# Patient Record
Sex: Female | Born: 1950 | Race: White | Hispanic: No | Marital: Married | Smoking: Never smoker
Health system: Southern US, Community
[De-identification: ages and names within clinical notes are randomized; demographics above are authoritative.]

## PROBLEM LIST (undated history)

## (undated) ENCOUNTER — Emergency Department: Discharge status: Absent without leave | Payer: Self-pay

## (undated) DIAGNOSIS — E785 Hyperlipidemia, unspecified: Secondary | ICD-10-CM

## (undated) DIAGNOSIS — I10 Essential (primary) hypertension: Secondary | ICD-10-CM

## (undated) DIAGNOSIS — I341 Nonrheumatic mitral (valve) prolapse: Secondary | ICD-10-CM

## (undated) HISTORY — DX: Hyperlipidemia, unspecified: E78.5

## (undated) HISTORY — DX: Nonrheumatic mitral (valve) prolapse: I34.1

## (undated) HISTORY — DX: Essential (primary) hypertension: I10

## (undated) HISTORY — PX: TONSILLECTOMY: SUR1361

---

## 1998-05-22 ENCOUNTER — Other Ambulatory Visit: Admission: RE | Admit: 1998-05-22 | Discharge: 1998-05-22 | Payer: Self-pay | Admitting: Family Medicine

## 2006-08-23 ENCOUNTER — Encounter: Admission: RE | Admit: 2006-08-23 | Discharge: 2006-09-15 | Payer: Self-pay | Admitting: Orthopedic Surgery

## 2006-08-23 ENCOUNTER — Encounter: Admission: RE | Admit: 2006-08-23 | Discharge: 2006-08-23 | Payer: Self-pay | Admitting: Orthopedic Surgery

## 2006-12-31 ENCOUNTER — Emergency Department (HOSPITAL_COMMUNITY): Admission: EM | Admit: 2006-12-31 | Discharge: 2006-12-31 | Payer: Self-pay | Admitting: Emergency Medicine

## 2007-12-19 ENCOUNTER — Ambulatory Visit: Payer: Self-pay | Admitting: Occupational Medicine

## 2010-10-17 LAB — COMPREHENSIVE METABOLIC PANEL
AST: 17
Albumin: 3.6
Calcium: 8.7
Creatinine, Ser: 0.71
GFR calc Af Amer: >60
GFR calc non Af Amer: >60

## 2010-10-17 LAB — CBC
MCHC: 35.5
MCV: 85.6
Platelets: 264

## 2010-10-17 LAB — DIFFERENTIAL
Eosinophils Relative: 0
Lymphocytes Relative: 17
Lymphs Abs: 2
Monocytes Absolute: 1

## 2010-10-17 LAB — LIPASE, BLOOD: Lipase: 20

## 2010-10-17 LAB — URINALYSIS, ROUTINE W REFLEX MICROSCOPIC
Bilirubin Urine: NEGATIVE
Hgb urine dipstick: NEGATIVE
Protein, ur: NEGATIVE
Urobilinogen, UA: 0.2

## 2011-06-09 ENCOUNTER — Other Ambulatory Visit (HOSPITAL_COMMUNITY)
Admission: RE | Admit: 2011-06-09 | Discharge: 2011-06-09 | Disposition: A | Discharge status: Home / Self Care | Payer: 59 | Source: Ambulatory Visit | Attending: Family Medicine | Admitting: Family Medicine

## 2011-06-09 DIAGNOSIS — Z1159 Encounter for screening for other viral diseases: Secondary | ICD-10-CM | POA: Insufficient documentation

## 2011-06-09 DIAGNOSIS — Z124 Encounter for screening for malignant neoplasm of cervix: Secondary | ICD-10-CM | POA: Insufficient documentation

## 2011-11-03 ENCOUNTER — Encounter: Payer: Self-pay | Admitting: Emergency Medicine

## 2011-11-03 ENCOUNTER — Emergency Department
Admission: EM | Admit: 2011-11-03 | Discharge: 2011-11-03 | Disposition: A | Discharge status: Home / Self Care | Payer: 59 | Source: Home / Self Care | Attending: Family Medicine | Admitting: Family Medicine

## 2011-11-03 DIAGNOSIS — J029 Acute pharyngitis, unspecified: Secondary | ICD-10-CM

## 2011-11-03 MED ORDER — BENZONATATE 200 MG PO CAPS: 200.0000 mg | ORAL_CAPSULE | Freq: Every day | ORAL | Status: DC

## 2011-11-03 NOTE — ED Provider Notes (Signed)
History     CSN: [id number]  Arrival date & time 11/03/11  1305   First MD Initiated Contact with Patient 11/03/11 1313      Chief Complaint  Patient presents with  . Sore Throat     HPI Comments: Patient complains of approximately 2.5 day history of gradually progressive URI symptoms beginning with a mild sore throat, followed by progressive nasal congestion.  A cough started today.  Complains of fatigue but no myalgias.  Cough is generally non-productive.  There has been no pleuritic pain, shortness of breath, or wheezes.   The history is provided by the patient.    History reviewed. No pertinent past medical history.  Past Surgical History  Procedure Date  . Tonsillectomy     Family History  Problem Relation Age of Onset  . Cancer Mother   . Heart failure Father     History  Substance Use Topics  . Smoking status: Never Smoker   . Smokeless tobacco: Not on file  . Alcohol Use: No    OB History    Grav Para Term Preterm Abortions TAB SAB Ect Mult Living                  Review of Systems + sore throat + cough No pleuritic pain No wheezing + nasal congestion ? post-nasal drainage No sinus pain/pressure No itchy/red eyes ? earache No hemoptysis No SOB No fever/chills No nausea No vomiting No abdominal pain No diarrhea No urinary symptoms No skin rashes + fatigue No myalgias No headache Used OTC meds without relief  Allergies  Review of patient's allergies indicates no known allergies.  Home Medications   Current Outpatient Rx  Name Route Sig Dispense Refill  . BENZONATATE 200 MG PO CAPS Oral Take 1 capsule (200 mg total) by mouth at bedtime. Take as needed for cough 12 capsule 0    BP 141/70  Pulse 89  Temp 98.5 F (36.9 C) (Oral)  Resp 14  Ht 5\' 5"  (1.651 m)  Wt 187 lb (84.823 kg)  BMI 31.12 kg/m2  SpO2 97%  Physical Exam Nursing notes and Vital Signs reviewed. Appearance:  Patient appears stated age, and in no acute distress.   Patient is obese (BMI 31.1) Eyes:  Pupils are equal, round, and reactive to light and accomodation.  Extraocular movement is intact.  Conjunctivae are not inflamed  Ears:  Canals normal.  Tympanic membranes normal.  Nose:  Mildly congested turbinates.  No sinus tenderness.  Pharynx:  Normal Neck:  Supple.  Posterior nodes are prominent but nontender Lungs:  Clear to auscultation.  Breath sounds are equal.  Heart:  Regular rate and rhythm without murmurs, rubs, or gallops.  Abdomen:  Nontender without masses or hepatosplenomegaly.  Bowel sounds are present.  No CVA or flank tenderness.  Extremities:  No edema.  No calf tenderness Skin:  No rash present.   ED Course  Procedures  none  Labs Reviewed -  POCT rapid strep test negative    1. Acute pharyngitis; suspect early viral URI.  Centor score -1      MDM  There is no evidence of bacterial infection today.   Treat symptomatically for now  Prescription written for Benzonatate (Tessalon) to take at bedtime for night-time cough.  Take plain Mucinex (guaifenesin) twice daily for cough and congestion.  Increase fluid intake, rest. May use Afrin nasal spray (or generic oxymetazoline) twice daily for about 5 days.  Also recommend using saline nasal spray several  times daily and saline nasal irrigation (AYR is a common brand) Stop all antihistamines for now, and other non-prescription cough/cold preparations. May take Ibuprofen 200mg , 4 tabs every 8 hours with food for pain Recommend flu shot when well. Follow-up with family doctor if not improving 7 to 10 days.         Lattie Haw, MD 11/03/11 [phone]

## 2011-11-03 NOTE — ED Notes (Signed)
Sore throat, congestion, slight cough x 3 days

## 2011-11-05 ENCOUNTER — Telehealth: Payer: Self-pay | Admitting: *Deleted

## 2013-01-10 ENCOUNTER — Emergency Department
Admission: EM | Admit: 2013-01-10 | Discharge: 2013-01-10 | Disposition: A | Discharge status: Home / Self Care | Payer: 59 | Source: Home / Self Care | Attending: Emergency Medicine | Admitting: Emergency Medicine

## 2013-01-10 ENCOUNTER — Encounter: Payer: Self-pay | Admitting: Emergency Medicine

## 2013-01-10 DIAGNOSIS — R300 Dysuria: Secondary | ICD-10-CM

## 2013-01-10 LAB — POCT URINALYSIS DIP (MANUAL ENTRY)
Glucose, UA: NEGATIVE
Spec Grav, UA: 1.01 (ref 1.005–1.03)
Urobilinogen, UA: 0.2 (ref 0–1)

## 2013-01-10 MED ORDER — SULFAMETHOXAZOLE-TMP DS 800-160 MG PO TABS: 1.0000 | ORAL_TABLET | Freq: Two times a day (BID) | ORAL | Status: DC

## 2013-01-10 NOTE — ED Provider Notes (Signed)
CSN: [id number]     Arrival date & time 01/10/13  1430 History   First MD Initiated Contact with Patient 01/10/13 1434     No chief complaint on file.  (Consider location/radiation/quality/duration/timing/severity/associated sxs/prior Treatment) HPI Paulina is a 62 y.o. female who presents today with UTI symptoms for 3 days.  + dysuria + frequency + urgency No hematuria No vaginal discharge No fever/chills No lower abdominal pain No back pain No fatigue    No past medical history on file. Past Surgical History  Procedure Laterality Date  . Tonsillectomy     Family History  Problem Relation Age of Onset  . Cancer Mother   . Heart failure Father    History  Substance Use Topics  . Smoking status: Never Smoker   . Smokeless tobacco: Not on file  . Alcohol Use: No   OB History   Grav Para Term Preterm Abortions TAB SAB Ect Mult Living                 Review of Systems  All other systems reviewed and are negative.    Allergies  Review of patient's allergies indicates no known allergies.  Home Medications   Current Outpatient Rx  Name  Route  Sig  Dispense  Refill  . benzonatate (TESSALON) 200 MG capsule   Oral   Take 1 capsule (200 mg total) by mouth at bedtime. Take as needed for cough   12 capsule   0    There were no vitals taken for this visit. Physical Exam  Nursing note and vitals reviewed. Constitutional: She is oriented to person, place, and time. She appears well-developed and well-nourished.  HENT:  Head: Normocephalic and atraumatic.  Eyes: No scleral icterus.  Neck: Neck supple.  Cardiovascular: Regular rhythm and normal heart sounds.   Pulmonary/Chest: Effort normal and breath sounds normal. No respiratory distress.  Abdominal: Soft. Normal appearance and bowel sounds are normal. She exhibits no mass. There is no rebound, no guarding and no CVA tenderness.  Neurological: She is alert and oriented to person, place, and time.  Skin: Skin is  warm and dry.  Psychiatric: She has a normal mood and affect. Her speech is normal.    ED Course  Procedures (including critical care time) Labs Review Labs Reviewed - No data to display Imaging Review No results found.  EKG Interpretation    Date/Time:    Ventricular Rate:    PR Interval:    QRS Duration:   QT Interval:    QTC Calculation:   R Axis:     Text Interpretation:              MDM  No diagnosis found.  1) Take the prescribed antibiotic as directed. 2) A urinalysis was done in clinic.  A urine culture is pending. 3) Follow up with your PCP or urologist if not improving or if worsening symptoms.   Marlaine Hind, MD 01/10/13 [phone]

## 2013-01-10 NOTE — ED Notes (Signed)
Dysuria x 1 week

## 2013-01-12 ENCOUNTER — Telehealth: Payer: Self-pay | Admitting: *Deleted

## 2013-01-12 LAB — URINE CULTURE
Colony Count: NO GROWTH
Organism ID, Bacteria: NO GROWTH

## 2014-12-28 ENCOUNTER — Emergency Department
Admission: EM | Admit: 2014-12-28 | Discharge: 2014-12-28 | Disposition: A | Discharge status: Home / Self Care | Payer: Medicare Other | Source: Home / Self Care | Attending: Family Medicine | Admitting: Family Medicine

## 2014-12-28 DIAGNOSIS — J069 Acute upper respiratory infection, unspecified: Secondary | ICD-10-CM

## 2014-12-28 DIAGNOSIS — J0100 Acute maxillary sinusitis, unspecified: Secondary | ICD-10-CM

## 2014-12-28 MED ORDER — AMOXICILLIN 500 MG PO CAPS: 500.0000 mg | ORAL_CAPSULE | Freq: Two times a day (BID) | ORAL | Status: DC

## 2014-12-28 MED ORDER — FLUTICASONE PROPIONATE 50 MCG/ACT NA SUSP: 2.0000 | Freq: Every day | NASAL | Status: DC

## 2014-12-28 MED ORDER — BENZONATATE 100 MG PO CAPS: 100.0000 mg | ORAL_CAPSULE | Freq: Three times a day (TID) | ORAL | Status: DC

## 2014-12-28 NOTE — ED Provider Notes (Signed)
CSN: [id number]     Arrival date & time 12/28/14  1400 History   First MD Initiated Contact with Patient 12/28/14 1410     Chief Complaint  Patient presents with  . Cough   (Consider location/radiation/quality/duration/timing/severity/associated sxs/prior Treatment) HPI  Pt is a 64yo female presenting to [facility] with c/o gradually worsening nasal congestion with associated mild intermittent productive cough, facial pain and sore throat.  Symptoms started 4 days ago.  Cough did become worse last night as well as frontal headache. Pt states when she leans over her headache is even worse.  Temp 99.9 today.  Denies sick contacts or recent travel.  Denies chest pain or SOB.   History reviewed. No pertinent past medical history. Past Surgical History  Procedure Laterality Date  . Tonsillectomy     Family History  Problem Relation Age of Onset  . Cancer Mother   . Heart failure Father    Social History  Substance Use Topics  . Smoking status: Never Smoker   . Smokeless tobacco: None  . Alcohol Use: No   OB History    No data available     Review of Systems  Constitutional: Positive for fever, chills and fatigue.  HENT: Positive for congestion, postnasal drip, rhinorrhea, sinus pressure, sneezing, sore throat and voice change. Negative for ear pain and trouble swallowing.   Respiratory: Positive for cough. Negative for shortness of breath.   Cardiovascular: Negative for chest pain and palpitations.  Gastrointestinal: Negative for nausea, vomiting, abdominal pain and diarrhea.  Musculoskeletal: Negative for myalgias, back pain and arthralgias.  Skin: Negative for rash.  Neurological: Positive for headaches. Negative for dizziness and light-headedness.    Allergies  Z-pak  Home Medications   Prior to Admission medications   Medication Sig Start Date End Date Taking? Authorizing Provider  amoxicillin (AMOXIL) 500 MG capsule Take 1 capsule (500 mg total) by mouth 2 (two) times  daily. For 10 days 12/28/14   Noland Fordyce, PA-C  benzonatate (TESSALON) 100 MG capsule Take 1 capsule (100 mg total) by mouth every 8 (eight) hours. 12/28/14   Noland Fordyce, PA-C  benzonatate (TESSALON) 200 MG capsule Take 1 capsule (200 mg total) by mouth at bedtime. Take as needed for cough 11/03/11   Kandra Nicolas, MD  fluticasone (FLONASE) 50 MCG/ACT nasal spray Place 2 sprays into both nostrils daily. 12/28/14   Noland Fordyce, PA-C   Meds Ordered and Administered this Visit  Medications - No data to display  BP 177/82 mmHg  Pulse 90  Temp(Src) 99 F (37.2 C) (Oral)  Ht 5\' 6"  (1.676 m)  Wt 182 lb (82.555 kg)  BMI 29.39 kg/m2  SpO2 98% No data found.   Physical Exam  Constitutional: She appears well-developed and well-nourished. No distress.  HENT:  Head: Normocephalic and atraumatic.  Right Ear: Hearing, tympanic membrane, external ear and ear canal normal.  Left Ear: Hearing, tympanic membrane, external ear and ear canal normal.  Nose: Mucosal edema present. Right sinus exhibits maxillary sinus tenderness. Right sinus exhibits no frontal sinus tenderness. Left sinus exhibits maxillary sinus tenderness. Left sinus exhibits no frontal sinus tenderness.  Mouth/Throat: Uvula is midline and mucous membranes are normal. Posterior oropharyngeal erythema present. No oropharyngeal exudate, posterior oropharyngeal edema or tonsillar abscesses.  Eyes: Conjunctivae are normal. No scleral icterus.  Neck: Normal range of motion. Neck supple.  Cardiovascular: Normal rate, regular rhythm and normal heart sounds.   Pulmonary/Chest: Effort normal and breath sounds normal. No stridor. No respiratory distress.  She has no wheezes. She has no rales. She exhibits no tenderness.  Abdominal: Soft. She exhibits no distension and no mass. There is no tenderness. There is no rebound and no guarding.  Musculoskeletal: Normal range of motion.  Neurological: She is alert.  Skin: Skin is warm and dry.  She is not diaphoretic.  Nursing note and vitals reviewed.   ED Course  Procedures (including critical care time)  Labs Review Labs Reviewed - No data to display  Imaging Review No results found.     MDM   1. Acute maxillary sinusitis, recurrence not specified   2. Acute upper respiratory infection    Pt c/o worsening nasal congestions with cough and headache.    Exam c/w acute maxillary sinusitis given worsening sinus tenderness.  Rx: amoxicillin, tessalon, and Flonase.  Advised pt to use acetaminophen and ibuprofen as needed for fever and pain. Encouraged rest and fluids. F/u with PCP in 7-10 days if not improving, sooner if worsening. Pt verbalized understanding and agreement with tx plan.    Noland Fordyce, PA-C 12/28/14 [phone]

## 2014-12-28 NOTE — Discharge Instructions (Signed)
You may take 400-600mg Ibuprofen (Motrin) every 6-8 hours for fever and pain  °Alternate with Tylenol  °You may take 500mg Tylenol every 4-6 hours as needed for fever and pain  °Follow-up with your primary care provider next week for recheck of symptoms if not improving.  °Be sure to drink plenty of fluids and rest, at least 8hrs of sleep a night, preferably more while you are sick. °Return urgent care or go to closest ER if you cannot keep down fluids/signs of dehydration, fever not reducing with Tylenol, difficulty breathing/wheezing, stiff neck, worsening condition, or other concerns (see below)  °Please take antibiotics as prescribed and be sure to complete entire course even if you start to feel better to ensure infection does not come back. ° ° °Cool Mist Vaporizers °Vaporizers may help relieve the symptoms of a cough and cold. They add moisture to the air, which helps mucus to become thinner and less sticky. This makes it easier to breathe and cough up secretions. Cool mist vaporizers do not cause serious burns like hot mist vaporizers, which may also be called steamers or humidifiers. Vaporizers have not been proven to help with colds. You should not use a vaporizer if you are allergic to mold. °HOME CARE INSTRUCTIONS °· Follow the package instructions for the vaporizer. °· Do not use anything other than distilled water in the vaporizer. °· Do not run the vaporizer all of the time. This can cause mold or bacteria to grow in the vaporizer. °· Clean the vaporizer after each time it is used. °· Clean and dry the vaporizer well before storing it. °· Stop using the vaporizer if worsening respiratory symptoms develop. °  °This information is not intended to replace advice given to you by your health care provider. Make sure you discuss any questions you have with your health care provider. °  °Document Released: 09/26/2003 Document Revised: 01/03/2013 Document Reviewed: 05/18/2012 °Elsevier Interactive Patient  Education ©2016 [employer] ° °

## 2014-12-28 NOTE — ED Notes (Signed)
Started 4 days ago with sneezing, sore throat, alternating between runny and congested nose.  Cough became worse last night and felt like it moved to chest.  Has had a temp up to 99.8.  Hoarse and headache today.

## 2015-01-17 ENCOUNTER — Emergency Department
Admission: EM | Admit: 2015-01-17 | Discharge: 2015-01-17 | Disposition: A | Discharge status: Home / Self Care | Payer: Medicare Other | Source: Home / Self Care | Attending: Family Medicine | Admitting: Family Medicine

## 2015-01-17 ENCOUNTER — Encounter: Payer: Self-pay | Admitting: *Deleted

## 2015-01-17 DIAGNOSIS — R109 Unspecified abdominal pain: Secondary | ICD-10-CM

## 2015-01-17 DIAGNOSIS — N3001 Acute cystitis with hematuria: Secondary | ICD-10-CM | POA: Diagnosis not present

## 2015-01-17 DIAGNOSIS — R300 Dysuria: Secondary | ICD-10-CM

## 2015-01-17 LAB — POCT URINALYSIS DIP (MANUAL ENTRY)
Bilirubin, UA: NEGATIVE
Glucose, UA: NEGATIVE
Ketones, POC UA: NEGATIVE
Nitrite, UA: NEGATIVE
Protein Ur, POC: 30 — AB
Spec Grav, UA: 1.015 (ref 1.005–1.03)
Urobilinogen, UA: 0.2 (ref 0–1)
pH, UA: 6 (ref 5–8)

## 2015-01-17 MED ORDER — CEPHALEXIN 500 MG PO CAPS: 500.0000 mg | ORAL_CAPSULE | Freq: Two times a day (BID) | ORAL | Status: DC

## 2015-01-17 NOTE — Discharge Instructions (Signed)
You may try over the counter medication called Azo to help with bladder spasms.  This medication can make your urine orange, which is normal.   Please take antibiotics as prescribed and be sure to complete entire course even if you start to feel better to ensure infection does not come back.  You may take acetaminophen and ibuprofen for fever or pain.

## 2015-01-17 NOTE — ED Notes (Signed)
Pt c/o polyuria and dysuria since yesterday. Today temp of 100 @ home and intermittent flank pain.

## 2015-01-17 NOTE — ED Provider Notes (Signed)
CSN: [id number]     Arrival date & time 01/17/15  1126 History   First MD Initiated Contact with Patient 01/17/15 1129     Chief Complaint  Patient presents with  . Dysuria   (Consider location/radiation/quality/duration/timing/severity/associated sxs/prior Treatment) HPI  Pt is a 65yo female presenting to [facility] with c/o polyuria, dysuria, and Right sided flank pain that started yesterday. Symptoms are mild to moderate in severity.  She also reports a fever of 100*F yesterday.  She had tried drinking cranberry juice w/o relief.  Hx of UTI a few years ago. Denies seeing blood in urine.  Denies cough, congestion, n/v/d. Denies abdominal pain.  Denies vaginal symptoms.   History reviewed. No pertinent past medical history. Past Surgical History  Procedure Laterality Date  . Tonsillectomy     Family History  Problem Relation Age of Onset  . Cancer Mother   . Heart failure Father    Social History  Substance Use Topics  . Smoking status: Never Smoker   . Smokeless tobacco: Never Used  . Alcohol Use: No   OB History    No data available     Review of Systems  Constitutional: Negative for fever and chills.  HENT: Negative for congestion, sore throat, trouble swallowing and voice change.   Respiratory: Negative for cough and shortness of breath.   Cardiovascular: Negative for chest pain and palpitations.  Gastrointestinal: Negative for nausea, vomiting, abdominal pain and diarrhea.  Genitourinary: Positive for dysuria, urgency and frequency. Negative for hematuria and pelvic pain.  Musculoskeletal: Negative for myalgias and arthralgias. Back pain: Right lower.  Skin: Negative for rash.  All other systems reviewed and are negative.   Allergies  Z-pak  Home Medications   Prior to Admission medications   Medication Sig Start Date End Date Taking? Authorizing Provider  cephALEXin (KEFLEX) 500 MG capsule Take 1 capsule (500 mg total) by mouth 2 (two) times daily. For 7 days 01/17/15    Noland Fordyce, PA-C  fluticasone [facility]) 50 MCG/ACT nasal spray Place 2 sprays into both nostrils daily. 12/28/14   Noland Fordyce, PA-C   Meds Ordered and Administered this Visit  Medications - No data to display  BP 153/73 mmHg  Pulse 95  Temp(Src) 98.8 F (37.1 C) (Oral)  Resp 16  Wt 185 lb (83.915 kg)  SpO2 97% No data found.   Physical Exam  Constitutional: She appears well-developed and well-nourished. No distress.  HENT:  Head: Normocephalic and atraumatic.  Mouth/Throat: Oropharynx is clear and moist.  Eyes: Conjunctivae are normal. No scleral icterus.  Neck: Normal range of motion. Neck supple.  Cardiovascular: Normal rate, regular rhythm and normal heart sounds.   Pulmonary/Chest: Effort normal and breath sounds normal. No respiratory distress. She has no wheezes. She has no rales. She exhibits no tenderness.  Abdominal: Soft. She exhibits no distension and no mass. There is no tenderness. There is CVA tenderness (Right, mild). There is no rebound and no guarding.  Musculoskeletal: Normal range of motion.  Neurological: She is alert.  Skin: Skin is warm and dry. She is not diaphoretic.  Nursing note and vitals reviewed.   ED Course  Procedures (including critical care time)  Labs Review Labs Reviewed  POCT URINALYSIS DIP (MANUAL ENTRY) - Abnormal; Notable for the following:    Clarity, UA cloudy (*)    Blood, UA moderate (*)    Protein Ur, POC =30 (*)    Leukocytes, UA small (1+) (*)    All other components within normal  limits  URINE CULTURE    Imaging Review No results found.    MDM   1. Dysuria   2. Acute cystitis with hematuria   3. Right flank pain    Pt c/o urinary symptoms with Right sided flank pain and fever of 100*F yesterday.  UA c/w UTI Culture sent  Rx: keflex May take OTC Azo and alternate acetaminophen and ibuprofen for pain or fever. F/u with PCP in 4-5 days if not improving, sooner if worsening. Patient verbalized  understanding and agreement with treatment plan.     Noland Fordyce, PA-C 01/17/15 1211

## 2015-01-19 LAB — URINE CULTURE: Colony Count: >=100000

## 2015-01-22 ENCOUNTER — Telehealth: Payer: Self-pay | Admitting: Emergency Medicine

## 2015-06-17 ENCOUNTER — Other Ambulatory Visit: Payer: Self-pay | Admitting: Family Medicine

## 2015-06-17 DIAGNOSIS — Z1231 Encounter for screening mammogram for malignant neoplasm of breast: Secondary | ICD-10-CM

## 2015-06-26 ENCOUNTER — Ambulatory Visit (INDEPENDENT_AMBULATORY_CARE_PROVIDER_SITE_OTHER): Payer: Medicare Other

## 2015-06-26 DIAGNOSIS — Z1231 Encounter for screening mammogram for malignant neoplasm of breast: Secondary | ICD-10-CM

## 2015-12-23 ENCOUNTER — Emergency Department
Admission: EM | Admit: 2015-12-23 | Discharge: 2015-12-23 | Disposition: A | Discharge status: Home / Self Care | Payer: Medicare Other | Source: Home / Self Care | Attending: Family Medicine | Admitting: Family Medicine

## 2015-12-23 ENCOUNTER — Encounter: Payer: Self-pay | Admitting: Emergency Medicine

## 2015-12-23 DIAGNOSIS — J069 Acute upper respiratory infection, unspecified: Secondary | ICD-10-CM | POA: Diagnosis not present

## 2015-12-23 DIAGNOSIS — B9789 Other viral agents as the cause of diseases classified elsewhere: Secondary | ICD-10-CM

## 2015-12-23 DIAGNOSIS — J029 Acute pharyngitis, unspecified: Secondary | ICD-10-CM

## 2015-12-23 MED ORDER — GUAIFENESIN ER 600 MG PO TB12: 600.0000 mg | ORAL_TABLET | Freq: Two times a day (BID) | ORAL | 0 refills | Status: DC | PRN

## 2015-12-23 MED ORDER — IPRATROPIUM BROMIDE 0.06 % NA SOLN: 2.0000 | Freq: Four times a day (QID) | NASAL | 12 refills | Status: DC

## 2015-12-23 MED ORDER — BENZONATATE 100 MG PO CAPS: 100.0000 mg | ORAL_CAPSULE | Freq: Three times a day (TID) | ORAL | 0 refills | Status: DC

## 2015-12-23 NOTE — ED Provider Notes (Signed)
CSN: [id number]     Arrival date & time 12/23/15  1338 History   First MD Initiated Contact with Patient 12/23/15 1401     Chief Complaint  Patient presents with  . Cough   (Consider location/radiation/quality/duration/timing/severity/associated sxs/prior Treatment) HPI Holly Morrison is a 65 y.o. female presenting to UC with c/o 3 days of gradually worsening minimally productive cough with clear sputum, scratchy throat with hoarse voice, and rhinorrhea.  Denies fever, chills, n/v/d. Denies facial pain or pressure. Denies chest pain or SOB.  No medications tried PTA.   History reviewed. No pertinent past medical history. Past Surgical History:  Procedure Laterality Date  . TONSILLECTOMY     Family History  Problem Relation Age of Onset  . Cancer Mother   . Heart failure Father    Social History  Substance Use Topics  . Smoking status: Never Smoker  . Smokeless tobacco: Never Used  . Alcohol use No   OB History    No data available     Review of Systems  Constitutional: Negative for chills and fever.  HENT: Positive for congestion, rhinorrhea, sore throat and voice change. Negative for ear pain and trouble swallowing.   Respiratory: Positive for cough. Negative for shortness of breath.   Cardiovascular: Negative for chest pain and palpitations.  Gastrointestinal: Negative for abdominal pain, diarrhea, nausea and vomiting.  Musculoskeletal: Negative for arthralgias, back pain and myalgias.  Skin: Negative for rash.    Allergies  Z-pak [azithromycin]  Home Medications   Prior to Admission medications   Medication Sig Start Date End Date Taking? Authorizing Provider  benzonatate (TESSALON) 100 MG capsule Take 1-2 capsules (100-200 mg total) by mouth every 8 (eight) hours. 12/23/15   Noland Fordyce, PA-C  cephALEXin (KEFLEX) 500 MG capsule Take 1 capsule (500 mg total) by mouth 2 (two) times daily. For 7 days 01/17/15   Noland Fordyce, PA-C  fluticasone [facility]) 50  MCG/ACT nasal spray Place 2 sprays into both nostrils daily. 12/28/14   Noland Fordyce, PA-C  guaiFENesin (MUCINEX) 600 MG 12 hr tablet Take 1 tablet (600 mg total) by mouth 2 (two) times daily as needed. 12/23/15   Noland Fordyce, PA-C  ipratropium (ATROVENT) 0.06 % nasal spray Place 2 sprays into both nostrils 4 (four) times daily. 12/23/15   Noland Fordyce, PA-C   Meds Ordered and Administered this Visit  Medications - No data to display  BP 145/75 (BP Location: Left Arm)   Pulse 88   Temp 98.3 F (36.8 C) (Oral)   Ht 5\' 5"  (1.651 m)   Wt 180 lb (81.6 kg)   SpO2 97%   BMI 29.95 kg/m  No data found.   Physical Exam  Constitutional: She appears well-developed and well-nourished. No distress.  HENT:  Head: Normocephalic and atraumatic.  Right Ear: Tympanic membrane normal.  Left Ear: Tympanic membrane normal.  Nose: Nose normal. Right sinus exhibits no maxillary sinus tenderness and no frontal sinus tenderness. Left sinus exhibits no maxillary sinus tenderness and no frontal sinus tenderness.  Mouth/Throat: Uvula is midline, oropharynx is clear and moist and mucous membranes are normal.  Eyes: Conjunctivae are normal. No scleral icterus.  Neck: Normal range of motion. Neck supple.  Cardiovascular: Normal rate, regular rhythm and normal heart sounds.   Pulmonary/Chest: Effort normal and breath sounds normal. No stridor. No respiratory distress. She has no wheezes. She has no rales.  Musculoskeletal: Normal range of motion.  Lymphadenopathy:    She has no cervical adenopathy.  Neurological:  She is alert.  Skin: Skin is warm and dry. She is not diaphoretic.  Nursing note and vitals reviewed.   Urgent Care Course   Clinical Course     Procedures (including critical care time)  Labs Review Labs Reviewed - No data to display  Imaging Review No results found.    MDM   1. Viral URI with cough   2. Sore throat    Pt c/u 3 days of URI symptoms. No evidence of bacterial  infection at this time. Encouraged symptomatic treatment.  Rx: ipratropium nasal spray, guaifenesin, and tessalon  Home care instructions provided. F/u with PCP In 7-10 days if not improving, sooner if significantly worsening.   Noland Fordyce, PA-C 12/23/15 [phone]

## 2015-12-23 NOTE — ED Triage Notes (Signed)
Slightly productive cough, runny nose, hoarseness x 3 days

## 2016-06-16 ENCOUNTER — Other Ambulatory Visit: Payer: Self-pay | Admitting: Family Medicine

## 2016-06-17 ENCOUNTER — Other Ambulatory Visit (HOSPITAL_COMMUNITY)
Admission: RE | Admit: 2016-06-17 | Discharge: 2016-06-17 | Disposition: A | Discharge status: Home / Self Care | Payer: Medicare Other | Source: Ambulatory Visit | Attending: Family Medicine | Admitting: Family Medicine

## 2016-06-17 DIAGNOSIS — Z124 Encounter for screening for malignant neoplasm of cervix: Secondary | ICD-10-CM | POA: Insufficient documentation

## 2016-06-24 LAB — CYTOLOGY - PAP
DIAGNOSIS: NEGATIVE
HPV (WINDOPATH): NOT DETECTED

## 2016-12-18 ENCOUNTER — Other Ambulatory Visit: Payer: Self-pay | Admitting: Family Medicine

## 2016-12-18 DIAGNOSIS — Z1239 Encounter for other screening for malignant neoplasm of breast: Secondary | ICD-10-CM

## 2016-12-23 ENCOUNTER — Ambulatory Visit (INDEPENDENT_AMBULATORY_CARE_PROVIDER_SITE_OTHER): Payer: Medicare Other

## 2016-12-23 DIAGNOSIS — Z1231 Encounter for screening mammogram for malignant neoplasm of breast: Secondary | ICD-10-CM

## 2016-12-23 DIAGNOSIS — Z1239 Encounter for other screening for malignant neoplasm of breast: Secondary | ICD-10-CM

## 2017-09-02 ENCOUNTER — Emergency Department (INDEPENDENT_AMBULATORY_CARE_PROVIDER_SITE_OTHER): Payer: Medicare Other

## 2017-09-02 ENCOUNTER — Emergency Department (INDEPENDENT_AMBULATORY_CARE_PROVIDER_SITE_OTHER)
Admission: EM | Admit: 2017-09-02 | Discharge: 2017-09-02 | Disposition: A | Discharge status: Home / Self Care | Payer: Medicare Other | Source: Home / Self Care | Attending: Family Medicine | Admitting: Family Medicine

## 2017-09-02 DIAGNOSIS — S8391XA Sprain of unspecified site of right knee, initial encounter: Secondary | ICD-10-CM | POA: Diagnosis not present

## 2017-09-02 DIAGNOSIS — M1711 Unilateral primary osteoarthritis, right knee: Secondary | ICD-10-CM

## 2017-09-02 NOTE — ED Triage Notes (Signed)
Right knee injury, twisted yesterday getting out of the car, heard a pop.

## 2017-09-02 NOTE — ED Provider Notes (Signed)
[facility]    CSN: [id number] Arrival date & time: 09/02/17  1626     History   Chief Complaint Chief Complaint  Patient presents with  . Knee Injury    HPI Holly Morrison is a 67 y.o. female.   While reaching into her car yesterday patient felt a popping sensation in her right lateral knee.  She has had persistent mild pain, and the knee feels unstable as if it might give way or lock up.  The history is provided by the patient.  Knee Pain  Location:  Knee Time since incident:  1 day Injury: yes   Mechanism of injury comment:  Twisted knee Knee location:  R knee Pain details:    Quality:  Aching   Radiates to:  Does not radiate   Severity:  Moderate   Onset quality:  Sudden   Duration:  1 day   Timing:  Constant   Progression:  Unchanged Chronicity:  New Prior injury to area:  No Relieved by:  Nothing Worsened by:  Bearing weight and flexion Ineffective treatments:  Acetaminophen Associated symptoms: decreased ROM, muscle weakness, stiffness and swelling   Associated symptoms: no numbness and no tingling     No past medical history on file.  There are no active problems to display for this patient.   Past Surgical History:  Procedure Laterality Date  . TONSILLECTOMY      OB History   None      Home Medications    Prior to Admission medications   Medication Sig Start Date End Date Taking? Authorizing Provider  benzonatate (TESSALON) 100 MG capsule Take 1-2 capsules (100-200 mg total) by mouth every 8 (eight) hours. 12/23/15   Noe Gens, PA-C  cephALEXin (KEFLEX) 500 MG capsule Take 1 capsule (500 mg total) by mouth 2 (two) times daily. For 7 days 01/17/15   Noe Gens, PA-C  fluticasone [facility]) 50 MCG/ACT nasal spray Place 2 sprays into both nostrils daily. 12/28/14   Noe Gens, PA-C  guaiFENesin (MUCINEX) 600 MG 12 hr tablet Take 1 tablet (600 mg total) by mouth 2 (two) times daily as needed. 12/23/15   Noe Gens,  PA-C  ipratropium (ATROVENT) 0.06 % nasal spray Place 2 sprays into both nostrils 4 (four) times daily. 12/23/15   Noe Gens, PA-C    Family History Family History  Problem Relation Age of Onset  . Cancer Mother   . Heart failure Father     Social History Social History   Tobacco Use  . Smoking status: Never Smoker  . Smokeless tobacco: Never Used  Substance Use Topics  . Alcohol use: No  . Drug use: No     Allergies   Z-pak [azithromycin]   Review of Systems Review of Systems  Musculoskeletal: Positive for stiffness.  All other systems reviewed and are negative.    Physical Exam Triage Vital Signs ED Triage Vitals [09/02/17 1700]  Enc Vitals Group     BP (!) 156/83     Pulse Rate 99     Resp      Temp 98.8 F (37.1 C)     Temp Source Oral     SpO2 96 %     Weight      Height      Head Circumference      Peak Flow      Pain Score      Pain Loc      Pain Edu?  Excl. in [place]?    No data found.  Updated Vital Signs BP (!) 156/83 (BP Location: Right Arm)   Pulse 99   Temp 98.8 F (37.1 C) (Oral)   SpO2 96%   Visual Acuity Right Eye Distance:   Left Eye Distance:   Bilateral Distance:    Right Eye Near:   Left Eye Near:    Bilateral Near:     Physical Exam  Constitutional: She appears well-developed and well-nourished. No distress.  HENT:  Head: Normocephalic.  Eyes: Pupils are equal, round, and reactive to light.  Cardiovascular: Normal rate.  Pulmonary/Chest: Effort normal.  Musculoskeletal:       Right shoulder: She exhibits decreased range of motion.       Right knee: She exhibits decreased range of motion. She exhibits no swelling, no effusion, no ecchymosis, no deformity, no erythema, normal alignment, no LCL laxity and no MCL laxity. No tenderness found.  Right knee:  Patient has difficulty fully extending actively.   Minimal swelling.  No erythema or warmth.  Knee stable, negative drawer test.  McMurray test is suggestive of  lateral meniscus meniscus injury.    Neurological: She is alert.  Skin: Skin is warm and dry.  Nursing note and vitals reviewed.    UC Treatments / Results  Labs (all labs ordered are listed, but only abnormal results are displayed) Labs Reviewed - No data to display  EKG None  Radiology Dg Knee Complete 4 Views Right  Result Date: 09/02/2017 CLINICAL DATA:  Right knee pain after twisting injury yesterday. EXAM: RIGHT KNEE - COMPLETE 4+ VIEW COMPARISON:  None. FINDINGS: Small to moderate suprapatellar joint effusion. Tricompartmental osteoarthritis of the right knee with tibial spine spurring. Well corticated ossification projects adjacent to the medial tibial spine likely degenerative in etiology. IMPRESSION: 1. Joint effusion without acute osseous appearing abnormality. 2. 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 Electronically Signed   By: Ashley Royalty M.D.   On: 09/02/2017 17:35    Procedures Procedures (including critical care time)  Medications Ordered in UC Medications - No data to display  Initial Impression / Assessment and Plan / UC Course  I have reviewed the triage vital signs and the nursing notes.  Pertinent labs & imaging results that were available during my care of the patient were reviewed by me and considered in my medical decision making (see chart for details).    Suspect lateral meniscus injury.  Dispensed hinged knee brace. Followup with Dr. Aundria Mems or Dr. Lynne Leader ([facility]) if not improving about two weeks.    Final Clinical Impressions(s) / UC Diagnoses   Final diagnoses:  Sprain of right knee, unspecified ligament, initial encounter     Discharge Instructions     Apply ice pack for 20 to 30 minutes, 3 to 4 times daily  Continue until pain and swelling decrease.  Wear knee brace.  May take Ibuprofen 200mg , 4 tabs every 8 hours with food.  Begin range of motion and stretching exercises as  tolerated.    ED Prescriptions    None         Kandra Nicolas, MD 09/02/17 [phone]

## 2017-09-02 NOTE — Discharge Instructions (Addendum)
Apply ice pack for 20 to 30 minutes, 3 to 4 times daily  Continue until pain and swelling decrease.  Wear knee brace.  May take Ibuprofen 200mg , 4 tabs every 8 hours with food.  Begin range of motion and stretching exercises as tolerated.

## 2019-07-05 ENCOUNTER — Other Ambulatory Visit: Payer: Self-pay | Admitting: Family Medicine

## 2019-07-05 DIAGNOSIS — Z1239 Encounter for other screening for malignant neoplasm of breast: Secondary | ICD-10-CM

## 2019-07-12 ENCOUNTER — Other Ambulatory Visit: Payer: Self-pay

## 2019-07-12 ENCOUNTER — Ambulatory Visit (INDEPENDENT_AMBULATORY_CARE_PROVIDER_SITE_OTHER): Payer: Medicare Other

## 2019-07-12 DIAGNOSIS — Z1239 Encounter for other screening for malignant neoplasm of breast: Secondary | ICD-10-CM | POA: Diagnosis not present

## 2020-07-19 DIAGNOSIS — U071 COVID-19: Secondary | ICD-10-CM | POA: Diagnosis not present

## 2020-07-19 DIAGNOSIS — R21 Rash and other nonspecific skin eruption: Secondary | ICD-10-CM | POA: Diagnosis not present

## 2020-08-12 DIAGNOSIS — R7301 Impaired fasting glucose: Secondary | ICD-10-CM | POA: Diagnosis not present

## 2020-08-12 DIAGNOSIS — R350 Frequency of micturition: Secondary | ICD-10-CM | POA: Diagnosis not present

## 2020-08-12 DIAGNOSIS — R030 Elevated blood-pressure reading, without diagnosis of hypertension: Secondary | ICD-10-CM | POA: Diagnosis not present

## 2020-08-12 DIAGNOSIS — Z1211 Encounter for screening for malignant neoplasm of colon: Secondary | ICD-10-CM | POA: Diagnosis not present

## 2020-08-12 DIAGNOSIS — E7800 Pure hypercholesterolemia, unspecified: Secondary | ICD-10-CM | POA: Diagnosis not present

## 2020-11-14 DIAGNOSIS — E7800 Pure hypercholesterolemia, unspecified: Secondary | ICD-10-CM | POA: Diagnosis not present

## 2021-05-16 ENCOUNTER — Other Ambulatory Visit: Payer: Self-pay | Admitting: Family Medicine

## 2021-05-16 DIAGNOSIS — Z1231 Encounter for screening mammogram for malignant neoplasm of breast: Secondary | ICD-10-CM

## 2021-06-12 ENCOUNTER — Ambulatory Visit (INDEPENDENT_AMBULATORY_CARE_PROVIDER_SITE_OTHER): Payer: Medicare Other

## 2021-06-12 DIAGNOSIS — Z1231 Encounter for screening mammogram for malignant neoplasm of breast: Secondary | ICD-10-CM

## 2021-07-03 DIAGNOSIS — Z86010 Personal history of colonic polyps: Secondary | ICD-10-CM | POA: Diagnosis not present

## 2021-07-11 ENCOUNTER — Other Ambulatory Visit: Payer: Self-pay | Admitting: Family Medicine

## 2021-07-11 DIAGNOSIS — Z780 Asymptomatic menopausal state: Secondary | ICD-10-CM

## 2021-07-11 DIAGNOSIS — E2839 Other primary ovarian failure: Secondary | ICD-10-CM

## 2021-07-11 DIAGNOSIS — Z0000 Encounter for general adult medical examination without abnormal findings: Secondary | ICD-10-CM | POA: Diagnosis not present

## 2021-08-06 ENCOUNTER — Ambulatory Visit (INDEPENDENT_AMBULATORY_CARE_PROVIDER_SITE_OTHER): Payer: Medicare Other

## 2021-08-06 DIAGNOSIS — E2839 Other primary ovarian failure: Secondary | ICD-10-CM | POA: Diagnosis not present

## 2021-08-06 DIAGNOSIS — Z780 Asymptomatic menopausal state: Secondary | ICD-10-CM | POA: Diagnosis not present

## 2021-08-13 DIAGNOSIS — E559 Vitamin D deficiency, unspecified: Secondary | ICD-10-CM | POA: Diagnosis not present

## 2021-08-13 DIAGNOSIS — R350 Frequency of micturition: Secondary | ICD-10-CM | POA: Diagnosis not present

## 2021-08-13 DIAGNOSIS — R030 Elevated blood-pressure reading, without diagnosis of hypertension: Secondary | ICD-10-CM | POA: Diagnosis not present

## 2021-08-13 DIAGNOSIS — R6889 Other general symptoms and signs: Secondary | ICD-10-CM | POA: Diagnosis not present

## 2021-08-13 DIAGNOSIS — E7800 Pure hypercholesterolemia, unspecified: Secondary | ICD-10-CM | POA: Diagnosis not present

## 2021-08-13 DIAGNOSIS — R739 Hyperglycemia, unspecified: Secondary | ICD-10-CM | POA: Diagnosis not present

## 2021-09-04 DIAGNOSIS — N3001 Acute cystitis with hematuria: Secondary | ICD-10-CM | POA: Diagnosis not present

## 2021-09-04 DIAGNOSIS — H8111 Benign paroxysmal vertigo, right ear: Secondary | ICD-10-CM | POA: Diagnosis not present

## 2021-09-24 DIAGNOSIS — Z09 Encounter for follow-up examination after completed treatment for conditions other than malignant neoplasm: Secondary | ICD-10-CM | POA: Diagnosis not present

## 2021-09-24 DIAGNOSIS — K648 Other hemorrhoids: Secondary | ICD-10-CM | POA: Diagnosis not present

## 2021-09-24 DIAGNOSIS — D122 Benign neoplasm of ascending colon: Secondary | ICD-10-CM | POA: Diagnosis not present

## 2021-09-24 DIAGNOSIS — D123 Benign neoplasm of transverse colon: Secondary | ICD-10-CM | POA: Diagnosis not present

## 2021-09-24 DIAGNOSIS — K5730 Diverticulosis of large intestine without perforation or abscess without bleeding: Secondary | ICD-10-CM | POA: Diagnosis not present

## 2021-09-24 DIAGNOSIS — Z86010 Personal history of colonic polyps: Secondary | ICD-10-CM | POA: Diagnosis not present

## 2021-09-26 DIAGNOSIS — D123 Benign neoplasm of transverse colon: Secondary | ICD-10-CM | POA: Diagnosis not present

## 2021-09-29 ENCOUNTER — Ambulatory Visit: Payer: Medicare Other | Admitting: Internal Medicine

## 2021-10-16 NOTE — Progress Notes (Signed)
Referring-Holly Rolland Porter, PA-C Reason for referral-dyspnea on exertion/decreased exercise tolerance  HPI: 71 year old female for evaluation of dyspnea on exertion/decreased exercise tolerance at request of Marda Stalker, PA-C.  Laboratories August 2023 showed total cholesterol 203, LDL 127, HDL 53, creatinine 0.59, liver functions normal.  Patient states that she has dyspnea with more vigorous activities but not routine activities.  No orthopnea, PND, exertional chest pain or syncope.  No palpitations.  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  She feels as though her exercise tolerance is not as good as it used to be.  Current Outpatient Medications  Medication Sig Dispense Refill   meclizine (ANTIVERT) 25 MG tablet SMARTSIG:1 Tablet(s) By Mouth Every 12 Hours PRN     Multiple Vitamin (MULTIVITAMIN) tablet Take 1 tablet by mouth daily.     oxybutynin (DITROPAN XL) 15 MG 24 hr tablet Take 15 mg by mouth daily.     VITAMIN D PO Take 1 tablet by mouth daily.     No current facility-administered medications for this visit.    Allergies  Allergen Reactions   Z-Pak [Azithromycin]     vomiting     Past Medical History:  Diagnosis Date   Hyperlipidemia    Hypertension    MVP (mitral valve prolapse)     Past Surgical History:  Procedure Laterality Date   TONSILLECTOMY      Social History   Socioeconomic History   Marital status: Married    Spouse name: Not on file   Number of children: 3   Years of education: Not on file   Highest education level: Not on file  Occupational History   Not on file  Tobacco Use   Smoking status: Never   Smokeless tobacco: Never  Substance and Sexual Activity   Alcohol use: No   Drug use: No   Sexual activity: Not on file  Other Topics Concern   Not on file  Social History Narrative   Not on file   Social Determinants of Health   Financial Resource Strain: Not on file  Food Insecurity: Not on file   Transportation Needs: Not on file  Physical Activity: Not on file  Stress: Not on file  Social Connections: Not on file  Intimate Partner Violence: Not on file    Family History  Problem Relation Age of Onset   Cancer Mother    Heart failure Father     ROS: no fevers or chills, productive cough, hemoptysis, dysphasia, odynophagia, melena, hematochezia, dysuria, hematuria, rash, seizure activity, orthopnea, PND, pedal edema, claudication. Remaining systems are negative.  Physical Exam:   Blood pressure (!) 132/58, pulse 81, height '5\' 5"'$  (1.651 m), weight 166 lb 0.6 oz (75.3 kg), SpO2 97 %.  General:  Well developed/well nourished in NAD Skin warm/dry Patient not depressed No peripheral clubbing Back-normal HEENT-normal/normal eyelids Neck supple/normal carotid upstroke bilaterally; no bruits; no JVD; no thyromegaly chest - CTA/ normal expansion CV - RRR/normal S1 and S2; no murmurs, rubs or gallops;  PMI nondisplaced Abdomen -NT/ND, no HSM, no mass, + bowel sounds, no bruit 2+ femoral pulses, no bruits Ext-no edema, chords, 2+ DP Neuro-grossly nonfocal  ECG -normal sinus rhythm at a rate of 84, nonspecific ST changes.  Personally reviewed  A/P  1 fatigue/dyspnea on exertion-patient has some dyspnea on exertion.  I will arrange an echocardiogram to assess LV function.  2 hypertension-blood pressure is borderline.  She will follow and we will adjust as needed.  3 hyperlipidemia-I  will arrange a calcium score for risk stratification.  If elevated she will require statin.  4 mitral valve prolapse-patient carries a diagnosis of mitral valve prolapse.  We will arrange echocardiogram to further assess.  Kirk Ruths, MD

## 2021-10-29 ENCOUNTER — Encounter: Payer: Self-pay | Admitting: Cardiology

## 2021-10-29 ENCOUNTER — Ambulatory Visit: Payer: Medicare Other | Admitting: Cardiology

## 2021-10-29 VITALS — BP 132/58 | HR 81 | Ht 65.0 in | Wt 166.0 lb

## 2021-10-29 DIAGNOSIS — I10 Essential (primary) hypertension: Secondary | ICD-10-CM

## 2021-10-29 DIAGNOSIS — R0602 Shortness of breath: Secondary | ICD-10-CM

## 2021-10-29 DIAGNOSIS — E7800 Pure hypercholesterolemia, unspecified: Secondary | ICD-10-CM | POA: Diagnosis not present

## 2021-10-29 NOTE — Patient Instructions (Signed)
  Testing/Procedures:  Your physician has requested that you have an echocardiogram. Echocardiography is a painless test that uses sound waves to create images of your heart. It provides your doctor with information about the size and shape of your heart and how well your heart's chambers and valves are working. This procedure takes approximately one hour. There are no restrictions for this procedure. Please do NOT wear cologne, perfume, aftershave, or lotions (deodorant is allowed). Please arrive 15 minutes prior to your appointment time.  [address]-1ST FLOOR IMAGING DEPARTMENT  CORONARY CALCIUM SCORING CT AT THE [place] [facility] [facility]   Follow-Up: At [facility], you and your health needs are our priority.  As part of our continuing mission to provide you with exceptional heart care, we have created designated Provider Care Teams.  These Care Teams include your primary Cardiologist (physician) and Advanced Practice Providers (APPs -  Physician Assistants and Nurse Practitioners) who all work together to provide you with the care you need, when you need it.  We recommend signing up for the patient portal called "MyChart".  Sign up information is provided on this After Visit Summary.  MyChart is used to connect with patients for Virtual Visits (Telemedicine).  Patients are able to view lab/test results, encounter notes, upcoming appointments, etc.  Non-urgent messages can be sent to your provider as well.   To learn more about what you can do with MyChart, go to [email or web address].    Your next appointment:    AS NEEDED

## 2021-11-04 ENCOUNTER — Ambulatory Visit (HOSPITAL_BASED_OUTPATIENT_CLINIC_OR_DEPARTMENT_OTHER)
Admission: RE | Admit: 2021-11-04 | Discharge: 2021-11-04 | Disposition: A | Discharge status: Home / Self Care | Payer: Medicare Other | Source: Ambulatory Visit | Attending: Cardiology | Admitting: Cardiology

## 2021-11-04 DIAGNOSIS — R0602 Shortness of breath: Secondary | ICD-10-CM

## 2021-11-04 LAB — ECHOCARDIOGRAM COMPLETE
AR max vel: 2.05 cm2
AV Area VTI: 2.45 cm2
AV Area mean vel: 2.23 cm2
AV Mean grad: 6 mmHg
AV Peak grad: 12.7 mmHg
Ao pk vel: 1.78 m/s
Area-P 1/2: 4.15 cm2
Calc EF: 54.8 %
MV M vel: 4.39 m/s
MV Peak grad: 77.2 mmHg
S' Lateral: 2.9 cm
Single Plane A2C EF: 59.7 %
Single Plane A4C EF: 52.4 %

## 2021-11-05 ENCOUNTER — Telehealth: Payer: Self-pay | Admitting: Cardiology

## 2021-11-05 NOTE — Telephone Encounter (Signed)
Called patient, advised that results were not reviewed yet by Dr.Crenshaw, once they were we would call her.   Thank you!

## 2021-11-05 NOTE — Telephone Encounter (Signed)
Follow UP:     Patient said she just got her results from [place] for her Echo. She wantes to know if her Ct Calcium results are ready?

## 2021-11-10 ENCOUNTER — Telehealth: Payer: Self-pay | Admitting: *Deleted

## 2021-11-10 DIAGNOSIS — E7800 Pure hypercholesterolemia, unspecified: Secondary | ICD-10-CM

## 2021-11-10 MED ORDER — ROSUVASTATIN CALCIUM 20 MG PO TABS: 20.0000 mg | ORAL_TABLET | Freq: Every day | ORAL | 3 refills | Status: DC

## 2021-11-10 NOTE — Telephone Encounter (Signed)
pt aware of results  Lab orders mailed to the pt  Results forwarded  New script sent to the pharmacy

## 2021-11-10 NOTE — Telephone Encounter (Signed)
-----   Message from [name], MD sent at 11/10/2021  7:48 AM EDT ----- Minimally elevated calcium score.  Add Crestor 20 mg daily.  Check lipids and liver in 8 weeks.  Follow-up primary care for nodular infiltrate on the right. Holly Morrison

## 2021-11-13 ENCOUNTER — Telehealth: Payer: Self-pay | Admitting: Cardiology

## 2021-11-13 NOTE — Telephone Encounter (Signed)
  Pt is requesting to send copy of her echo result sent to her PCP Marda Stalker

## 2021-11-13 NOTE — Telephone Encounter (Signed)
Spoke to patient  attempted to  answer her question . She would like for Dr Stanford Breed to contact her.  She states she will pick up Rosuvastatin from the pharmacy at [facility].   She states called her primary and some one there states that Ms Holly Morrison dose not have a copy of Calcium score - routed  report   Aware will defer to Dr Stanford Breed

## 2021-11-13 NOTE — Telephone Encounter (Signed)
Patient stated she wanted our clinic to fax her recent echo to Marda Stalker, [place] at [phone]. Done.

## 2021-11-13 NOTE — Telephone Encounter (Signed)
-   Message from [name], MD sent at 11/10/2021  7:48 AM EDT ----- Minimally elevated calcium score.  Add Crestor 20 mg daily.  Check lipids and liver in 8 weeks.  Follow-up primary care for nodular infiltrate on the right. Holly Morrison    Echo [name], MD 11/04/2021  8:22 PM EDT     Normal LV function BC

## 2021-11-13 NOTE — Telephone Encounter (Signed)
Patient would like to discuss her echo and Coronary Calcium Score with Dr. Stanford Breed.  She is wondering if he can give her a call.

## 2021-11-14 NOTE — Telephone Encounter (Signed)
Spoke with pt, questions answered.

## 2022-02-24 ENCOUNTER — Encounter: Payer: Self-pay | Admitting: *Deleted

## 2022-02-24 ENCOUNTER — Other Ambulatory Visit: Payer: Self-pay | Admitting: *Deleted

## 2022-02-24 DIAGNOSIS — E7800 Pure hypercholesterolemia, unspecified: Secondary | ICD-10-CM

## 2022-03-16 DIAGNOSIS — E7800 Pure hypercholesterolemia, unspecified: Secondary | ICD-10-CM | POA: Diagnosis not present

## 2022-03-17 LAB — LIPID PANEL
Chol/HDL Ratio: 2.3 ratio (ref 0.0–4.4)
Cholesterol, Total: 164 mg/dL (ref 100–199)
HDL: 70 mg/dL (ref >39)
LDL Chol Calc (NIH): 81 mg/dL (ref 0–99)
Triglycerides: 65 mg/dL (ref 0–149)
VLDL Cholesterol Cal: 13 mg/dL (ref 5–40)

## 2022-03-17 LAB — HEPATIC FUNCTION PANEL
ALT: 24 IU/L (ref 0–32)
AST: 21 IU/L (ref 0–40)
Albumin: 4.5 g/dL (ref 3.8–4.8)
Alkaline Phosphatase: 80 IU/L (ref 44–121)
Bilirubin Total: 0.6 mg/dL (ref 0.0–1.2)
Bilirubin, Direct: 0.17 mg/dL (ref 0.00–0.40)
Total Protein: 7.3 g/dL (ref 6.0–8.5)

## 2022-03-19 ENCOUNTER — Telehealth: Payer: Self-pay | Admitting: *Deleted

## 2022-03-19 DIAGNOSIS — E7800 Pure hypercholesterolemia, unspecified: Secondary | ICD-10-CM

## 2022-03-19 MED ORDER — ROSUVASTATIN CALCIUM 40 MG PO TABS: 40.0000 mg | ORAL_TABLET | Freq: Every day | ORAL | 3 refills | Status: DC

## 2022-03-19 NOTE — Telephone Encounter (Signed)
-----   Message from [name], MD sent at 03/17/2022  7:13 AM EST ----- Increase crestor to 40 mg daily; lipids and liver 8 weeks Kirk Ruths

## 2022-03-19 NOTE — Telephone Encounter (Signed)
Spoke with pt, Aware of dr crenshaw's recommendations.  ?New script sent to the pharmacy  ?Lab orders mailed to the pt  ?

## 2022-05-19 DIAGNOSIS — E7800 Pure hypercholesterolemia, unspecified: Secondary | ICD-10-CM | POA: Diagnosis not present

## 2022-05-20 ENCOUNTER — Encounter: Payer: Self-pay | Admitting: *Deleted

## 2022-05-20 LAB — HEPATIC FUNCTION PANEL
ALT: 19 IU/L (ref 0–32)
AST: 16 IU/L (ref 0–40)
Albumin: 4.3 g/dL (ref 3.8–4.8)
Alkaline Phosphatase: 69 IU/L (ref 44–121)
Bilirubin Total: 0.4 mg/dL (ref 0.0–1.2)
Bilirubin, Direct: 0.15 mg/dL (ref 0.00–0.40)
Total Protein: 6.7 g/dL (ref 6.0–8.5)

## 2022-05-20 LAB — LIPID PANEL
Chol/HDL Ratio: 2.2 ratio (ref 0.0–4.4)
Cholesterol, Total: 125 mg/dL (ref 100–199)
HDL: 58 mg/dL (ref >39)
LDL Chol Calc (NIH): 53 mg/dL (ref 0–99)
Triglycerides: 70 mg/dL (ref 0–149)
VLDL Cholesterol Cal: 14 mg/dL (ref 5–40)

## 2022-07-03 IMAGING — MG MM DIGITAL SCREENING BILAT W/ TOMO AND CAD
8 series · 8 of 24 positions shown · non-contrast
Comparison: Previous exam(s).

CLINICAL DATA: Screening.

EXAM:
DIGITAL SCREENING BILATERAL MAMMOGRAM WITH TOMOSYNTHESIS AND CAD
TECHNIQUE: Bilateral screening digital craniocaudal and mediolateral oblique
mammograms were obtained. Bilateral screening digital breast
tomosynthesis was performed. The images were evaluated with
computer-aided detection.

[L MLO synth-2D]
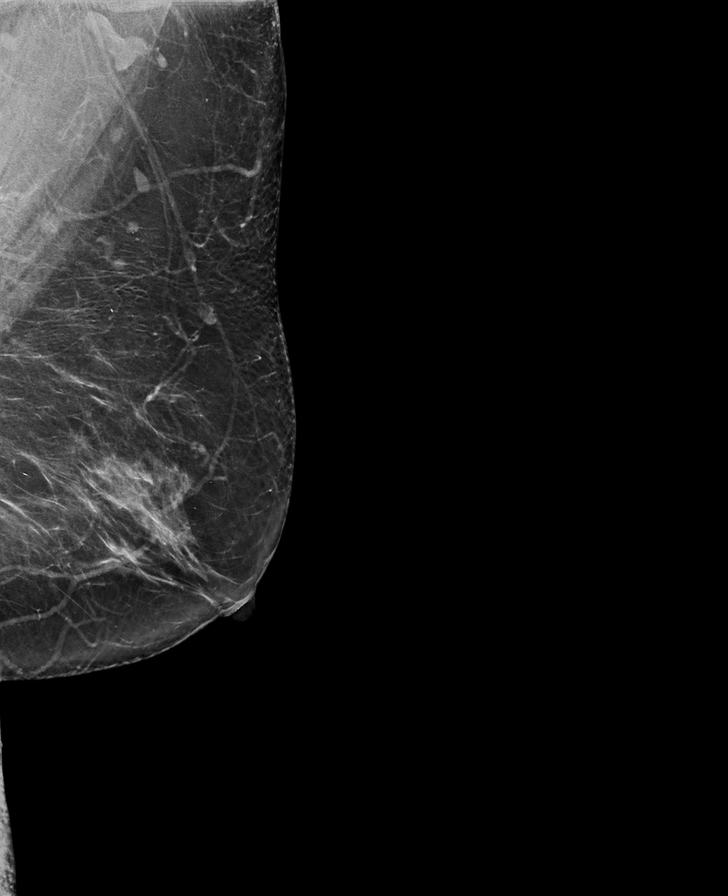

[L CC synth-2D]
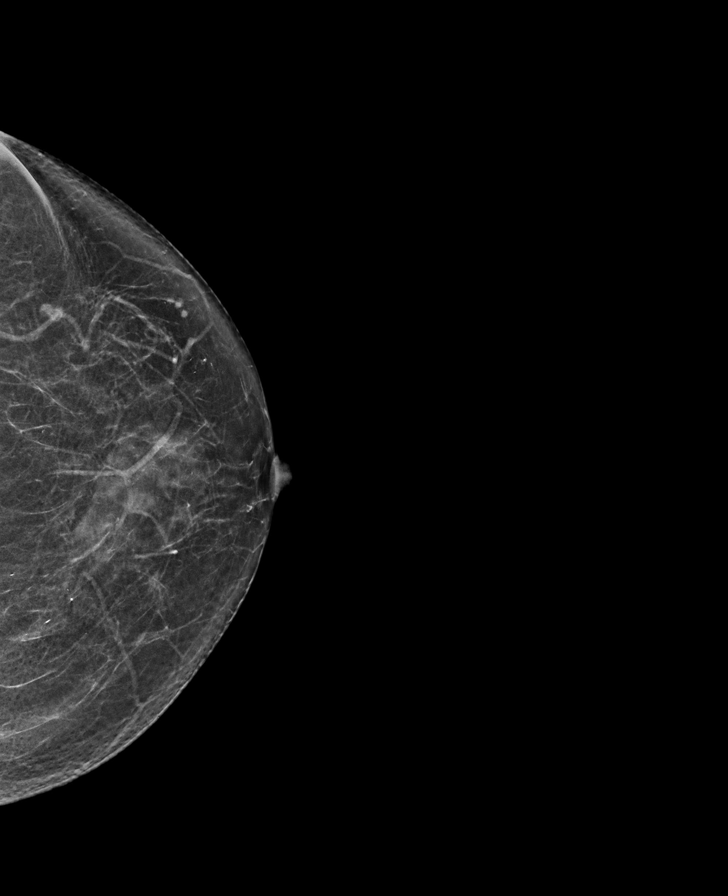

[R CC synth-2D]
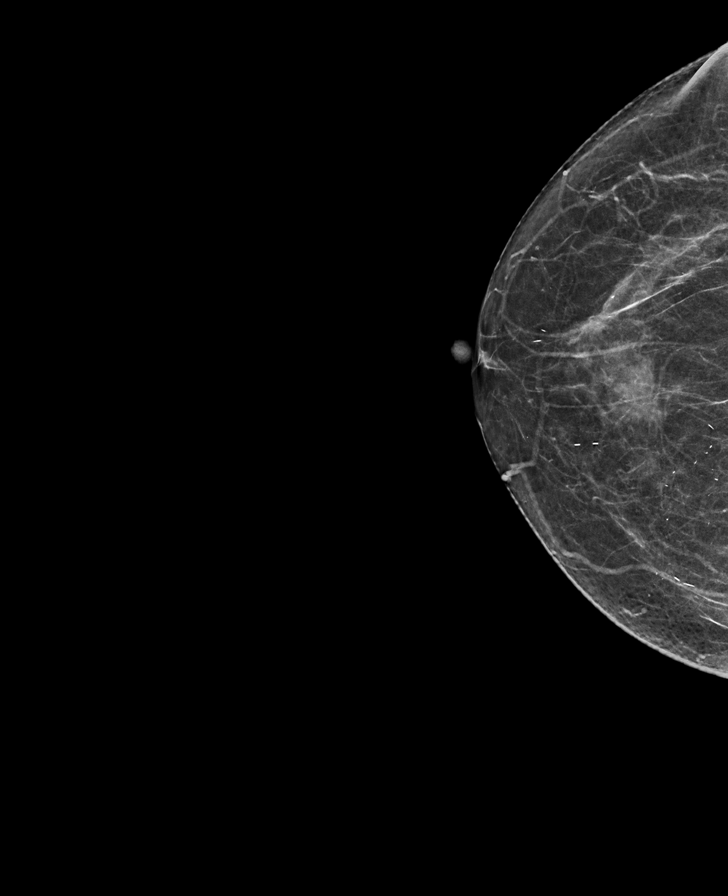

[R MLO synth-2D]
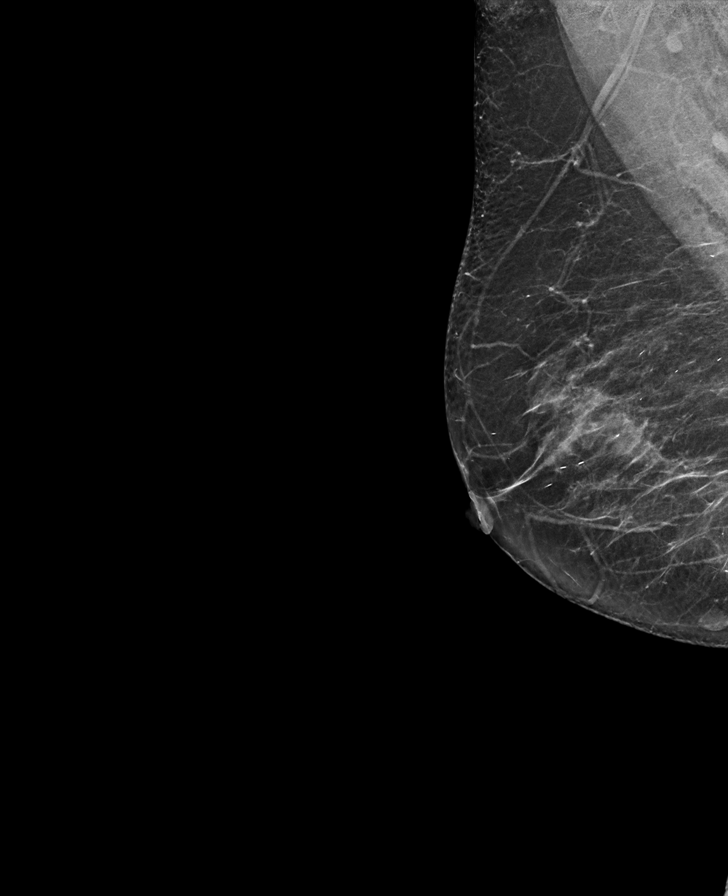

[L CC tomo · tomo slice 31/62.0]
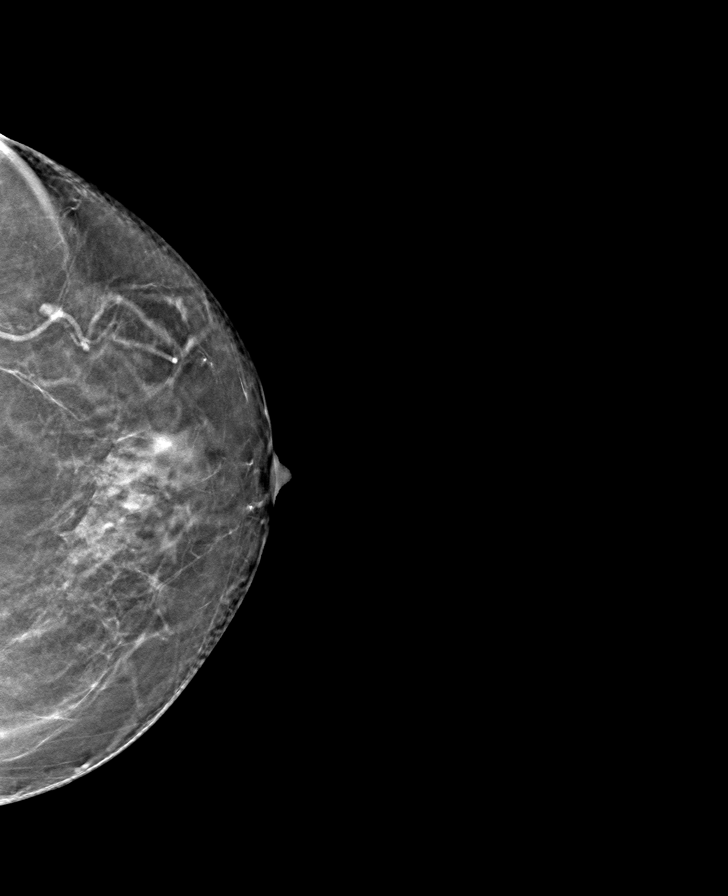

[L MLO tomo · tomo slice 39/76.0]
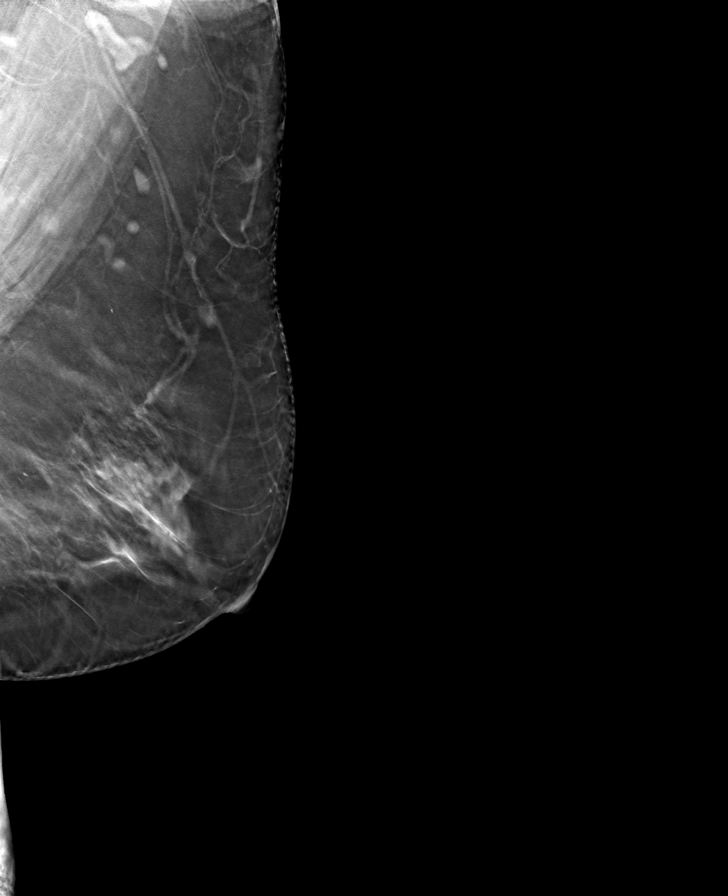

[R CC tomo · tomo slice 31/60.0]
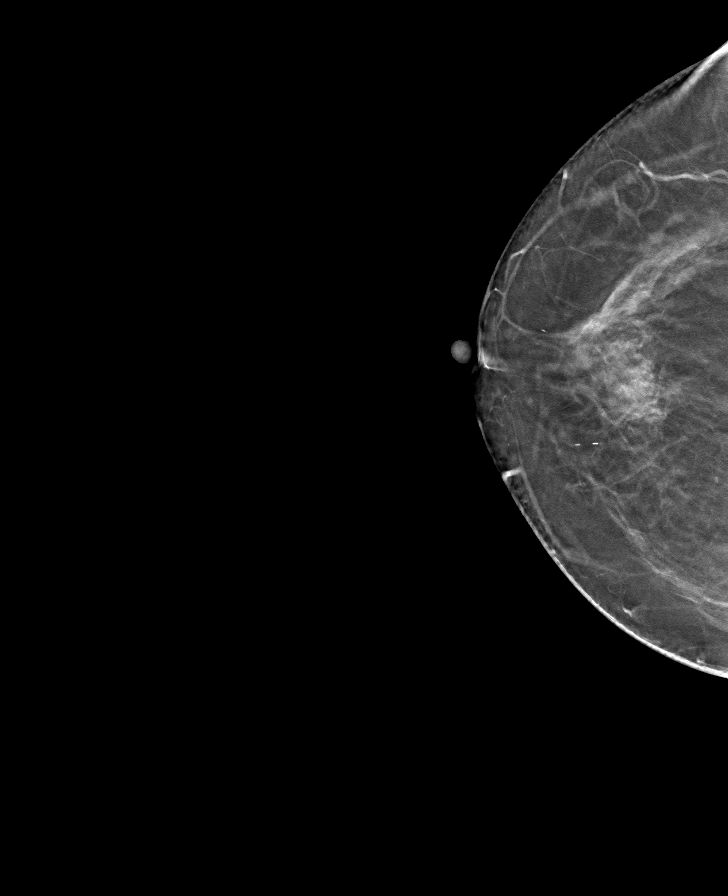

[R MLO tomo · tomo slice 35/68.0]
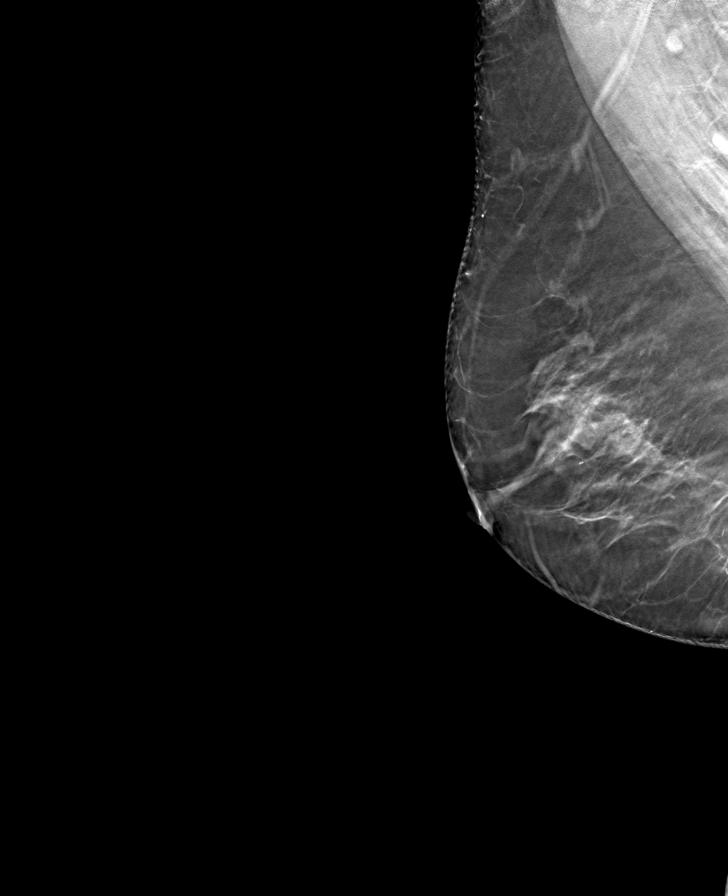

[8 of 24 positions shown; findings below may reference images not displayed]

ACR Breast Density Category b: There are scattered areas of
fibroglandular density.
FINDINGS: There are no findings suspicious for malignancy.
IMPRESSION: No mammographic evidence of malignancy. A result letter of this
screening mammogram will be mailed directly to the patient.

RECOMMENDATION:
Screening mammogram in one year. (Code:51-O-LD2)

BI-RADS CATEGORY  1: Negative.

## 2022-07-13 DIAGNOSIS — Z0000 Encounter for general adult medical examination without abnormal findings: Secondary | ICD-10-CM | POA: Diagnosis not present

## 2022-07-13 DIAGNOSIS — R350 Frequency of micturition: Secondary | ICD-10-CM | POA: Diagnosis not present

## 2022-07-13 DIAGNOSIS — I700 Atherosclerosis of aorta: Secondary | ICD-10-CM | POA: Diagnosis not present

## 2022-07-13 DIAGNOSIS — E559 Vitamin D deficiency, unspecified: Secondary | ICD-10-CM | POA: Diagnosis not present

## 2022-07-13 DIAGNOSIS — R7301 Impaired fasting glucose: Secondary | ICD-10-CM | POA: Diagnosis not present

## 2022-07-13 DIAGNOSIS — E7800 Pure hypercholesterolemia, unspecified: Secondary | ICD-10-CM | POA: Diagnosis not present

## 2022-07-13 DIAGNOSIS — R030 Elevated blood-pressure reading, without diagnosis of hypertension: Secondary | ICD-10-CM | POA: Diagnosis not present

## 2022-09-10 ENCOUNTER — Other Ambulatory Visit: Payer: Self-pay

## 2022-09-10 ENCOUNTER — Ambulatory Visit: Payer: Medicare Other | Attending: Family Medicine

## 2022-09-10 DIAGNOSIS — N3941 Urge incontinence: Secondary | ICD-10-CM | POA: Diagnosis present

## 2022-09-10 DIAGNOSIS — N393 Stress incontinence (female) (male): Secondary | ICD-10-CM | POA: Insufficient documentation

## 2022-09-10 DIAGNOSIS — M6281 Muscle weakness (generalized): Secondary | ICD-10-CM | POA: Diagnosis not present

## 2022-09-10 DIAGNOSIS — R279 Unspecified lack of coordination: Secondary | ICD-10-CM | POA: Diagnosis not present

## 2022-09-10 NOTE — Therapy (Signed)
OUTPATIENT PHYSICAL THERAPY FEMALE PELVIC EVALUATION   Patient Name: Holly Morrison MRN: [id number] DOB:1950/10/10, 72 y.o., female Today's Date: 09/10/2022  END OF SESSION:  PT End of Session - 09/10/22 1105     Visit Number 1    Date for PT Re-Evaluation 11/05/22    Authorization Type [employer] Medicare    PT Start Time 1104    PT Stop Time 1140    PT Time Calculation (min) 36 min    Activity Tolerance Patient tolerated treatment well    Behavior During Therapy WFL for tasks assessed/performed             Past Medical History:  Diagnosis Date   Hyperlipidemia    Hypertension    MVP (mitral valve prolapse)    Past Surgical History:  Procedure Laterality Date   TONSILLECTOMY     There are no problems to display for this patient.   PCP: Jarrett Soho, PA-C  REFERRING PROVIDER: Jarrett Soho, PA-C  REFERRING DIAG: R35.0 (ICD-10-CM) - Frequency of micturition  THERAPY DIAG:  Muscle weakness (generalized)  Unspecified lack of coordination  Urge incontinence  Stress incontinence (female) (female)  Rationale for Evaluation and Treatment: Rehabilitation  ONSET DATE: 2 years  SUBJECTIVE:                                                                                                                                                                                           SUBJECTIVE STATEMENT: Pt states that she is having difficulty with urinary frequency and urgency. She started taking oxybutynin and reports some benefit, but she is still having difficulty. She has not done any hormone replacement therapy or estrogen cream.   Fluid intake: Yes: drinks a lot of water, mostly just water, unsure how much she drinks daily    PAIN:  Are you having pain? No   PRECAUTIONS: None  RED FLAGS: None   WEIGHT BEARING RESTRICTIONS: No  FALLS:  Has patient fallen in last 6 months? No  LIVING ENVIRONMENT: Lives with: lives with their family Lives in:  House/apartment   OCCUPATION: reitred  PLOF: Independent  PATIENT GOALS: bladder control  PERTINENT HISTORY:  G3P3  BOWEL MOVEMENT: Pain with bowel movement: No Type of bowel movement:Frequency 1x/day and Strain No Fully empty rectum: Yes: - Leakage: No Pads: No Fiber supplement: No  URINATION: Pain with urination: No Fully empty bladder: Yes: but she has to lean forward at the end to finish - but only sometimes  Stream: Strong Urgency: Yes: will leak if she does not go right away Frequency: 2-3 hours, 2x/night Leakage: Urge to void, Walking to the bathroom, Coughing,  Sneezing, Laughing, and stepping down or jumping  Pads: Yes: 3-4 pads a day, 1 at night  INTERCOURSE: Not currently sexually active, no history of pain   PREGNANCY: Vaginal deliveries 3 Tearing No   PROLAPSE: Once in a while, not bothersome   OBJECTIVE:  09/10/22:  COGNITION: Overall cognitive status: Within functional limits for tasks assessed     SENSATION: Light touch: Appears intact Proprioception: Appears intact  GAIT: Comments: decreased bil hip extension  POSTURE: rounded shoulders, forward head, decreased lumbar lordosis, increased thoracic kyphosis, and posterior pelvic tilt   PALPATION:   General  No abdominal tenderness                External Perineal Exam WNL                             Internal Pelvic Floor Low tone, no tenderness  Patient confirms identification and approves PT to assess internal pelvic floor and treatment Yes  PELVIC MMT:   MMT eval  Vaginal 2/5, 4 second hold, 3 repeat contractions  Diastasis Recti 2 finger width separation at umbilicus with distortion in upper abdominals  (Blank rows = not tested)        TONE: low  PROLAPSE: Grade 2 in supine   TODAY'S TREATMENT:                                                                                                                              DATE:  09/10/22  EVAL  Neuromuscular  re-education: Pelvic floor contraction training Quick flicks Long holds Urge drill    PATIENT EDUCATION:  Education details: See above Person educated: Patient Education method: Explanation, Demonstration, Tactile cues, Verbal cues, and Handouts Education comprehension: verbalized understanding  HOME EXERCISE PROGRAM: [id number]  ASSESSMENT:  CLINICAL IMPRESSION: Patient is a 72 y.o. female who was seen today for physical therapy evaluation and treatment for urge and stress urinary incontinence. Exam findings notable for abnormal posture, core weakness (diastasis recti with distortion), pelvic floor weakness, decreased pelvic floor endurance, decreased pelvic floor coordination, and grade 2 anterior vaginal wall laxity. Signs and symptoms are most consistent with pelvic floor weakness and poor pressure management. Initial treatment consisted of pelvic floor contraction training and the urge drill. She will continue to benefit from skilled PT intervention in order to improve bladder control and begin/progress functional strengthening program.   OBJECTIVE IMPAIRMENTS: decreased activity tolerance, decreased coordination, decreased endurance, decreased strength, increased fascial restrictions, increased muscle spasms, impaired tone, postural dysfunction, and pain.   ACTIVITY LIMITATIONS: continence  PARTICIPATION LIMITATIONS: community activity  PERSONAL FACTORS: 1 comorbidity: medical history  are also affecting patient's functional outcome.   REHAB POTENTIAL: Good  CLINICAL DECISION MAKING: Stable/uncomplicated  EVALUATION COMPLEXITY: Low   GOALS: Goals reviewed with patient? Yes  SHORT TERM GOALS: Target date: 10/08/22  Pt will be independent with HEP.   Baseline: Goal status: INITIAL  2.  Pt will be independent with the knack, urge suppression technique, and double voiding in order to improve bladder habits and decrease urinary incontinence.   Baseline:  Goal status:  INITIAL  3.  Pt will be able to correctly perform diaphragmatic breathing and appropriate pressure management in order to prevent worsening vaginal wall laxity and improve pelvic floor A/ROM.   Baseline:  Goal status: INITIAL  LONG TERM GOALS: Target date: 11/05/22  Pt will be independent with advanced HEP.   Baseline:  Goal status: INITIAL  2.  Pt will decrease pad use to no more than 1 during the day and 1 at night.  Baseline:  Goal status: INITIAL  3.  Pt will be able to go 2-3 hours in between voids without urgency or incontinence in order to improve QOL and perform all functional activities with less difficulty.   Baseline:  Goal status: INITIAL  4.  Pt will demonstrate normal pelvic floor muscle tone and A/ROM, able to achieve 4/5 strength with contractions and 10 sec endurance, in order to provide appropriate lumbopelvic support in functional activities.   Baseline:  Goal status: INITIAL   PLAN:  PT FREQUENCY: 1-2x/week  PT DURATION: 8 weeks  PLANNED INTERVENTIONS: Therapeutic exercises, Therapeutic activity, Neuromuscular re-education, Balance training, Gait training, Patient/Family education, Self Care, Joint mobilization, Dry Needling, Biofeedback, and Manual therapy  PLAN FOR NEXT SESSION: Plan to begin core training  Julio Alm, PT, DPT08/29/2411:36 AM

## 2022-09-10 NOTE — Patient Instructions (Signed)
Urge Incontinence  Ideal urination frequency is every 2-4 wakeful hours, which equates to 5-8 times within a 24-hour period.   Urge incontinence is leakage that occurs when the bladder muscle contracts, creating a sudden need to go before getting to the bathroom.   Going too often when your bladder isn't actually full can disrupt the body's automatic signals to store and hold urine longer, which will increase urgency/frequency.  In this case, the bladder "is running the show" and strategies can be learned to retrain this pattern.   One should be able to control the first urge to urinate, at around 155m.  The bladder can hold up to a "grande latte," or 409m To help you gain control, practice the Urge Drill below when urgency strikes.  This drill will help retrain your bladder signals and allow you to store and hold urine longer.  The overall goal is to stretch out your time between voids to reach a more manageable voiding schedule.    Practice your "quick flicks" often throughout the day (each waking hour) even when you don't need feel the urge to go.  This will help strengthen your pelvic floor muscles, making them more effective in controlling leakage.  Urge Drill  When you feel an urge to go, follow these steps to regain control: Stop what you are doing and be still Take one deep breath, directing your air into your abdomen Think an affirming thought, such as "I've got this." Do 5 quick flicks of your pelvic floor Walk with control to the bathroom to void, or delay voiding  Br[facility] Surgery[address]SuTotowarLone Rock[place] [id number] # [phone]

## 2022-09-15 ENCOUNTER — Other Ambulatory Visit: Payer: Self-pay | Admitting: Family Medicine

## 2022-09-15 DIAGNOSIS — Z1231 Encounter for screening mammogram for malignant neoplasm of breast: Secondary | ICD-10-CM

## 2022-10-20 DIAGNOSIS — D229 Melanocytic nevi, unspecified: Secondary | ICD-10-CM | POA: Diagnosis not present

## 2022-10-21 ENCOUNTER — Ambulatory Visit: Payer: Medicare Other

## 2022-10-21 DIAGNOSIS — Z1231 Encounter for screening mammogram for malignant neoplasm of breast: Secondary | ICD-10-CM | POA: Diagnosis not present

## 2022-12-14 ENCOUNTER — Ambulatory Visit: Payer: Medicare Other

## 2022-12-21 ENCOUNTER — Ambulatory Visit: Payer: Medicare Other | Attending: Family Medicine

## 2022-12-21 DIAGNOSIS — R279 Unspecified lack of coordination: Secondary | ICD-10-CM | POA: Insufficient documentation

## 2022-12-21 DIAGNOSIS — M6281 Muscle weakness (generalized): Secondary | ICD-10-CM | POA: Insufficient documentation

## 2022-12-21 DIAGNOSIS — R350 Frequency of micturition: Secondary | ICD-10-CM | POA: Insufficient documentation

## 2022-12-21 DIAGNOSIS — N3946 Mixed incontinence: Secondary | ICD-10-CM | POA: Insufficient documentation

## 2022-12-21 DIAGNOSIS — N393 Stress incontinence (female) (male): Secondary | ICD-10-CM

## 2022-12-21 DIAGNOSIS — N3941 Urge incontinence: Secondary | ICD-10-CM | POA: Diagnosis present

## 2022-12-21 NOTE — Therapy (Signed)
OUTPATIENT PHYSICAL THERAPY FEMALE PELVIC EVALUATION   Patient Name: Holly Morrison MRN: [id number] DOB:1950-02-14, 72 y.o., female Today's Date: 12/21/2022  END OF SESSION:  PT End of Session - 12/21/22 1103     Visit Number 2    Date for PT Re-Evaluation 02/15/23    Authorization Type [employer] medicare    Authorization Time Period 12/14/2022 to 02/08/2023    Authorization - Visit Number 1    Authorization - Number of Visits 8    Progress Note Due on Visit 10    PT Start Time 1103    PT Stop Time 1143    PT Time Calculation (min) 40 min    Activity Tolerance Patient tolerated treatment well    Behavior During Therapy WFL for tasks assessed/performed              Past Medical History:  Diagnosis Date   Hyperlipidemia    Hypertension    MVP (mitral valve prolapse)    Past Surgical History:  Procedure Laterality Date   TONSILLECTOMY     There are no problems to display for this patient.   PCP: Jarrett Soho, PA-C  REFERRING PROVIDER: Jarrett Soho, PA-C  REFERRING DIAG: R35.0 (ICD-10-CM) - Frequency of micturition  THERAPY DIAG:  Muscle weakness (generalized)  Unspecified lack of coordination  Urge incontinence  Stress incontinence (female) (female)  Rationale for Evaluation and Treatment: Rehabilitation  ONSET DATE: 2 years  SUBJECTIVE:                                                                                                                                                                                           SUBJECTIVE STATEMENT:  Pt states that she feels like she is seeing a little bit of improvement. She is trying to walk more. She has seen a reduction in pad usage, but still has about the same amount of leaking with coughing/sneezing. PAIN:  Are you having pain? No   PRECAUTIONS: None  RED FLAGS: None   WEIGHT BEARING RESTRICTIONS: No  FALLS:  Has patient fallen in last 6 months? No  LIVING ENVIRONMENT: Lives with:  lives with their family Lives in: House/apartment   OCCUPATION: reitred  PLOF: Independent  PATIENT GOALS: bladder control  PERTINENT HISTORY:  G3P3  BOWEL MOVEMENT: Pain with bowel movement: No Type of bowel movement:Frequency 1x/day and Strain No Fully empty rectum: Yes: - Leakage: No Pads: No Fiber supplement: No  URINATION: Pain with urination: No Fully empty bladder: Yes: but she has to lean forward at the end to finish - but only sometimes  Stream: Strong Urgency: Yes: will leak if she does  not go right away Frequency: 2-3 hours, 2x/night Leakage: Urge to void, Walking to the bathroom, Coughing, Sneezing, Laughing, and stepping down or jumping  Pads: Yes: 3-4 pads a day, 1 at night  INTERCOURSE: Not currently sexually active, no history of pain   PREGNANCY: Vaginal deliveries 3 Tearing No   PROLAPSE: Once in a while, not bothersome   OBJECTIVE:  12/21/22: Core strength: pt able to find transversus abdominus contraction with pelvic floor muscle contraction today in supine; initially could not contract with breathing and used increased abdominal pressure to achieve   09/10/22:  COGNITION: Overall cognitive status: Within functional limits for tasks assessed     SENSATION: Light touch: Appears intact Proprioception: Appears intact  GAIT: Comments: decreased bil hip extension  POSTURE: rounded shoulders, forward head, decreased lumbar lordosis, increased thoracic kyphosis, and posterior pelvic tilt   PALPATION:   General  No abdominal tenderness                External Perineal Exam WNL                             Internal Pelvic Floor Low tone, no tenderness  Patient confirms identification and approves PT to assess internal pelvic floor and treatment Yes  PELVIC MMT:   MMT eval  Vaginal 2/5, 4 second hold, 3 repeat contractions  Diastasis Recti 2 finger width separation at umbilicus with distortion in upper abdominals  (Blank rows = not  tested)        TONE: low  PROLAPSE: Grade 2 in supine   TODAY'S TREATMENT:                                                                                                                              DATE:  12/21/22 Neuromuscular re-education: Transversus abdominus training with multimodal cues for improved motor control and breath coordination  Transversus abdominus isometric in supine 10x Supine bil UE squeeze with transversus abdominus and pelvic floor muscle 12x Supine hip abduction red band with pelvic floor muscle and transversus abdominus 12x Bridge with hip adduction, transversus abdominus, and pelvic floor muscle 2 x 10  Supine resisted march red band 10x bil with pelvic floor muscle and transversus abdominus  Therapeutic activities: Urge drill review  Pelvic floor muscle strengthening HEP review The knack Timing of fluids at night - stopping 3 hours before bed   09/10/22  EVAL  Neuromuscular re-education: Pelvic floor contraction training Quick flicks Long holds Urge drill    PATIENT EDUCATION:  Education details: See above Person educated: Patient Education method: Programmer, multimedia, Demonstration, Tactile cues, Verbal cues, and Handouts Education comprehension: verbalized understanding  HOME EXERCISE PROGRAM: [id number]  ASSESSMENT:  CLINICAL IMPRESSION: Pt has been unable to return since her first visit several months ago, but has been working on initial HEP and believes she has seen some progress in bladder control. She reports using 1-2 pads less  each day and she is able to hold urine up to 4 hours without extreme urgency. She has not seen any progress with stress urinary incontinence; due to this we went over the knack and how to use functionally to help reduce leaking. She did very well with core training, and was able to incorporate into other exercises with pelvic floor muscles. Hep updated this session. She will continue to benefit from skilled PT  intervention in order to improve bladder control and begin/progress functional strengthening program.   OBJECTIVE IMPAIRMENTS: decreased activity tolerance, decreased coordination, decreased endurance, decreased strength, increased fascial restrictions, increased muscle spasms, impaired tone, postural dysfunction, and pain.   ACTIVITY LIMITATIONS: continence  PARTICIPATION LIMITATIONS: community activity  PERSONAL FACTORS: 1 comorbidity: medical history  are also affecting patient's functional outcome.   REHAB POTENTIAL: Good  CLINICAL DECISION MAKING: Stable/uncomplicated  EVALUATION COMPLEXITY: Low   GOALS: Goals reviewed with patient? Yes  SHORT TERM GOALS: Target date: 10/08/22 - 12/21/22  Pt will be independent with HEP.   Baseline: Goal status: IN PROGRESS 12/21/22  2.  Pt will be independent with the knack, urge suppression technique, and double voiding in order to improve bladder habits and decrease urinary incontinence.   Baseline:  Goal status: IN PROGRESS 12/21/22  3.  Pt will be able to correctly perform diaphragmatic breathing and appropriate pressure management in order to prevent worsening vaginal wall laxity and improve pelvic floor A/ROM.   Baseline:  Goal status: IN PROGRESS 12/21/22  LONG TERM GOALS: Target date: 11/05/22 - updated 12/21/22  Pt will be independent with advanced HEP.   Baseline:  Goal status: IN PROGRESS 12/21/22  2.  Pt will decrease pad use to no more than 1 during the day and 1 at night.  Baseline: she is now only using 3-4 pads a day (was using 5) Goal status: IN PROGRESS 12/21/22  3.  Pt will be able to go 2-3 hours in between voids without urgency or incontinence in order to improve QOL and perform all functional activities with less difficulty.   Baseline: She can go up to 4 hours without urgency Goal status: MET 12/21/22  4.  Pt will demonstrate normal pelvic floor muscle tone and A/ROM, able to achieve 4/5 strength with  contractions and 10 sec endurance, in order to provide appropriate lumbopelvic support in functional activities.   Baseline:  Goal status: IN PROGRESS 12/21/22  5.  Pt will report no leaks with laughing, coughing, sneezing in order to improve comfort with interpersonal relationships and community activities.   Baseline:  Goal status: INITIAL   PLAN:  PT FREQUENCY: 1-2x/week  PT DURATION: 8 weeks  PLANNED INTERVENTIONS: Therapeutic exercises, Therapeutic activity, Neuromuscular re-education, Balance training, Gait training, Patient/Family education, Self Care, Joint mobilization, Dry Needling, Biofeedback, and Manual therapy  PLAN FOR NEXT SESSION: Progress core strengthening activities.   Julio Alm, PT, DPT12/09/2409:34 AM

## 2022-12-28 ENCOUNTER — Ambulatory Visit: Payer: Medicare Other

## 2022-12-28 DIAGNOSIS — M6281 Muscle weakness (generalized): Secondary | ICD-10-CM | POA: Diagnosis not present

## 2022-12-28 DIAGNOSIS — R350 Frequency of micturition: Secondary | ICD-10-CM | POA: Diagnosis not present

## 2022-12-28 DIAGNOSIS — N3946 Mixed incontinence: Secondary | ICD-10-CM | POA: Diagnosis not present

## 2022-12-28 DIAGNOSIS — R279 Unspecified lack of coordination: Secondary | ICD-10-CM

## 2022-12-28 NOTE — Therapy (Signed)
OUTPATIENT PHYSICAL THERAPY FEMALE PELVIC EVALUATION   Patient Name: Holly Morrison MRN: [id number] DOB:12/23/1950, 72 y.o., female Today's Date: 12/28/2022  END OF SESSION:  PT End of Session - 12/28/22 1025     Visit Number 3    Date for PT Re-Evaluation 02/15/23    Authorization Type [employer] medicare    Authorization Time Period 12/14/2022 to 02/08/2023    Authorization - Visit Number 2    Authorization - Number of Visits 8    Progress Note Due on Visit 10    PT Start Time 1024    PT Stop Time 1058    PT Time Calculation (min) 34 min    Activity Tolerance Patient tolerated treatment well    Behavior During Therapy WFL for tasks assessed/performed               Past Medical History:  Diagnosis Date   Hyperlipidemia    Hypertension    MVP (mitral valve prolapse)    Past Surgical History:  Procedure Laterality Date   TONSILLECTOMY     There are no active problems to display for this patient.   PCP: Jarrett Soho, PA-C  REFERRING PROVIDER: Jarrett Soho, PA-C  REFERRING DIAG: R35.0 (ICD-10-CM) - Frequency of micturition  THERAPY DIAG:  Muscle weakness (generalized)  Unspecified lack of coordination  Rationale for Evaluation and Treatment: Rehabilitation  ONSET DATE: 2 years  SUBJECTIVE:                                                                                                                                                                                           SUBJECTIVE STATEMENT:  Pt states that she has been having a Rt inner thigh cramp that has kept her from performing exercises. She feels like leaking might be a little bit better than it was. She is trying to pay attention to the amount of liquid she is taking in and not drinking as much in the late afternoon and evening.   PAIN:  Are you having pain? No   PRECAUTIONS: None  RED FLAGS: None   WEIGHT BEARING RESTRICTIONS: No  FALLS:  Has patient fallen in last 6 months?  No  LIVING ENVIRONMENT: Lives with: lives with their family Lives in: House/apartment   OCCUPATION: reitred  PLOF: Independent  PATIENT GOALS: bladder control  PERTINENT HISTORY:  G3P3  BOWEL MOVEMENT: Pain with bowel movement: No Type of bowel movement:Frequency 1x/day and Strain No Fully empty rectum: Yes: - Leakage: No Pads: No Fiber supplement: No  URINATION: Pain with urination: No Fully empty bladder: Yes: but she has to lean forward at the end to  finish - but only sometimes  Stream: Strong Urgency: Yes: will leak if she does not go right away Frequency: 2-3 hours, 2x/night Leakage: Urge to void, Walking to the bathroom, Coughing, Sneezing, Laughing, and stepping down or jumping  Pads: Yes: 3-4 pads a day, 1 at night  INTERCOURSE: Not currently sexually active, no history of pain   PREGNANCY: Vaginal deliveries 3 Tearing No   PROLAPSE: Once in a while, not bothersome   OBJECTIVE:  12/21/22: Core strength: pt able to find transversus abdominus contraction with pelvic floor muscle contraction today in supine; initially could not contract with breathing and used increased abdominal pressure to achieve   09/10/22:  COGNITION: Overall cognitive status: Within functional limits for tasks assessed     SENSATION: Light touch: Appears intact Proprioception: Appears intact  GAIT: Comments: decreased bil hip extension  POSTURE: rounded shoulders, forward head, decreased lumbar lordosis, increased thoracic kyphosis, and posterior pelvic tilt   PALPATION:   General  No abdominal tenderness                External Perineal Exam WNL                             Internal Pelvic Floor Low tone, no tenderness  Patient confirms identification and approves PT to assess internal pelvic floor and treatment Yes  PELVIC MMT:   MMT eval  Vaginal 2/5, 4 second hold, 3 repeat contractions  Diastasis Recti 2 finger width separation at umbilicus with distortion in  upper abdominals  (Blank rows = not tested)        TONE: low  PROLAPSE: Grade 2 in supine   TODAY'S TREATMENT:                                                                                                                              DATE:  12/28/22 Manual: Supine soft tissue mobilization to Rt adductors with P/ROM  Neuromuscular re-education: Supine hip adduction with transversus abdominus and pelvic floor muscle 2 x 10 Supine hip abduction red band with pelvic floor muscle and transversus abdominus 12x Bridge with hip adduction, transversus abdominus, and pelvic floor muscle 2 x 10  Supine resisted march red band 10x bil with pelvic floor muscle and transversus abdominus  Exercises: Bent knee fall out 10x bil Standing adductor stretch at counter 60 sec bil Lower trunk rotations 2 x 10  12/21/22 Neuromuscular re-education: Transversus abdominus training with multimodal cues for improved motor control and breath coordination  Transversus abdominus isometric in supine 10x Supine bil UE squeeze with transversus abdominus and pelvic floor muscle 12x Supine hip abduction red band with pelvic floor muscle and transversus abdominus 12x Bridge with hip adduction, transversus abdominus, and pelvic floor muscle 2 x 10  Supine resisted march red band 10x bil with pelvic floor muscle and transversus abdominus  Therapeutic activities: Urge drill review  Pelvic floor muscle strengthening  HEP review The knack Timing of fluids at night - stopping 3 hours before bed   09/10/22  EVAL  Neuromuscular re-education: Pelvic floor contraction training Quick flicks Long holds Urge drill    PATIENT EDUCATION:  Education details: See above Person educated: Patient Education method: Explanation, Demonstration, Tactile cues, Verbal cues, and Handouts Education comprehension: verbalized understanding  HOME EXERCISE PROGRAM: [id number]  ASSESSMENT:  CLINICAL IMPRESSION: Pt continuing to  improve with some minor improvements in urinary incontinence since last week. However, she has had some difficulty working on HEP progressions due to cramping in her Rt LE. We performed some soft tissue mobilization to this area and P/ROM to help decrease tightness/soreness from this cramping. We also added in some mobility exercises to work on decreasing frequency of cramping and soreness; HEP updated with these activities. She tolerated all treatment very well today with decreased soreness in Rt LE and good improvements in coordination of breathing with exercise. She will continue to benefit from skilled PT intervention in order to improve bladder control and begin/progress functional strengthening program.   OBJECTIVE IMPAIRMENTS: decreased activity tolerance, decreased coordination, decreased endurance, decreased strength, increased fascial restrictions, increased muscle spasms, impaired tone, postural dysfunction, and pain.   ACTIVITY LIMITATIONS: continence  PARTICIPATION LIMITATIONS: community activity  PERSONAL FACTORS: 1 comorbidity: medical history  are also affecting patient's functional outcome.   REHAB POTENTIAL: Good  CLINICAL DECISION MAKING: Stable/uncomplicated  EVALUATION COMPLEXITY: Low   GOALS: Goals reviewed with patient? Yes  SHORT TERM GOALS: Target date: 10/08/22 - 12/21/22  Pt will be independent with HEP.   Baseline: Goal status: IN PROGRESS 12/21/22  2.  Pt will be independent with the knack, urge suppression technique, and double voiding in order to improve bladder habits and decrease urinary incontinence.   Baseline:  Goal status: IN PROGRESS 12/21/22  3.  Pt will be able to correctly perform diaphragmatic breathing and appropriate pressure management in order to prevent worsening vaginal wall laxity and improve pelvic floor A/ROM.   Baseline:  Goal status: IN PROGRESS 12/21/22  LONG TERM GOALS: Target date: 11/05/22 - updated 12/21/22  Pt will be  independent with advanced HEP.   Baseline:  Goal status: IN PROGRESS 12/21/22  2.  Pt will decrease pad use to no more than 1 during the day and 1 at night.  Baseline: she is now only using 3-4 pads a day (was using 5) Goal status: IN PROGRESS 12/21/22  3.  Pt will be able to go 2-3 hours in between voids without urgency or incontinence in order to improve QOL and perform all functional activities with less difficulty.   Baseline: She can go up to 4 hours without urgency Goal status: MET 12/21/22  4.  Pt will demonstrate normal pelvic floor muscle tone and A/ROM, able to achieve 4/5 strength with contractions and 10 sec endurance, in order to provide appropriate lumbopelvic support in functional activities.   Baseline:  Goal status: IN PROGRESS 12/21/22  5.  Pt will report no leaks with laughing, coughing, sneezing in order to improve comfort with interpersonal relationships and community activities.   Baseline:  Goal status: INITIAL   PLAN:  PT FREQUENCY: 1-2x/week  PT DURATION: 8 weeks  PLANNED INTERVENTIONS: Therapeutic exercises, Therapeutic activity, Neuromuscular re-education, Balance training, Gait training, Patient/Family education, Self Care, Joint mobilization, Dry Needling, Biofeedback, and Manual therapy  PLAN FOR NEXT SESSION: Progress core strengthening activities.   Julio Alm, PT, DPT12/16/2410:49 AM

## 2023-01-11 ENCOUNTER — Ambulatory Visit: Payer: Medicare Other

## 2023-01-11 DIAGNOSIS — R350 Frequency of micturition: Secondary | ICD-10-CM | POA: Diagnosis not present

## 2023-01-11 DIAGNOSIS — R293 Abnormal posture: Secondary | ICD-10-CM

## 2023-01-11 DIAGNOSIS — M6281 Muscle weakness (generalized): Secondary | ICD-10-CM | POA: Diagnosis not present

## 2023-01-11 DIAGNOSIS — R279 Unspecified lack of coordination: Secondary | ICD-10-CM | POA: Diagnosis not present

## 2023-01-11 DIAGNOSIS — N3946 Mixed incontinence: Secondary | ICD-10-CM | POA: Diagnosis not present

## 2023-01-11 NOTE — Therapy (Signed)
OUTPATIENT PHYSICAL THERAPY FEMALE PELVIC EVALUATION   Patient Name: HAELEE Morrison MRN: [id number] DOB:1950/03/12, 72 y.o., female Today's Date: 01/11/2023  END OF SESSION:  PT End of Session - 01/11/23 1200     Visit Number 4    Date for PT Re-Evaluation 02/15/23    Authorization Type [employer] medicare    Authorization Time Period 12/14/2022 to 02/08/2023    Authorization - Visit Number 3    Authorization - Number of Visits 8    Progress Note Due on Visit 10    PT Start Time 1156    PT Stop Time 1228    PT Time Calculation (min) 32 min    Activity Tolerance Patient tolerated treatment well    Behavior During Therapy WFL for tasks assessed/performed                Past Medical History:  Diagnosis Date   Hyperlipidemia    Hypertension    MVP (mitral valve prolapse)    Past Surgical History:  Procedure Laterality Date   TONSILLECTOMY     There are no active problems to display for this patient.   PCP: Jarrett Soho, PA-C  REFERRING PROVIDER: Jarrett Soho, PA-C  REFERRING DIAG: R35.0 (ICD-10-CM) - Frequency of micturition  THERAPY DIAG:  Muscle weakness (generalized)  Unspecified lack of coordination  Abnormal posture  Rationale for Evaluation and Treatment: Rehabilitation  ONSET DATE: 2 years  SUBJECTIVE:                                                                                                                                                                                           SUBJECTIVE STATEMENT: Pt states that she feels like bladder is doing better and she is making it to the bathroom most of the time. She is not waking up as frequently at night. She has only had cramps in leg 1x/.   PAIN:  Are you having pain? No   PRECAUTIONS: None  RED FLAGS: None   WEIGHT BEARING RESTRICTIONS: No  FALLS:  Has patient fallen in last 6 months? No  LIVING ENVIRONMENT: Lives with: lives with their family Lives in:  House/apartment   OCCUPATION: reitred  PLOF: Independent  PATIENT GOALS: bladder control  PERTINENT HISTORY:  G3P3  BOWEL MOVEMENT: Pain with bowel movement: No Type of bowel movement:Frequency 1x/day and Strain No Fully empty rectum: Yes: - Leakage: No Pads: No Fiber supplement: No  URINATION: Pain with urination: No Fully empty bladder: Yes: but she has to lean forward at the end to finish - but only sometimes  Stream: Strong Urgency: Yes: will leak if she does not  go right away Frequency: 2-3 hours, 2x/night Leakage: Urge to void, Walking to the bathroom, Coughing, Sneezing, Laughing, and stepping down or jumping  Pads: Yes: 3-4 pads a day, 1 at night  INTERCOURSE: Not currently sexually active, no history of pain   PREGNANCY: Vaginal deliveries 3 Tearing No   PROLAPSE: Once in a while, not bothersome   OBJECTIVE:  12/21/22: Core strength: pt able to find transversus abdominus contraction with pelvic floor muscle contraction today in supine; initially could not contract with breathing and used increased abdominal pressure to achieve   09/10/22:  COGNITION: Overall cognitive status: Within functional limits for tasks assessed     SENSATION: Light touch: Appears intact Proprioception: Appears intact  GAIT: Comments: decreased bil hip extension  POSTURE: rounded shoulders, forward head, decreased lumbar lordosis, increased thoracic kyphosis, and posterior pelvic tilt   PALPATION:   General  No abdominal tenderness                External Perineal Exam WNL                             Internal Pelvic Floor Low tone, no tenderness  Patient confirms identification and approves PT to assess internal pelvic floor and treatment Yes  PELVIC MMT:   MMT eval  Vaginal 2/5, 4 second hold, 3 repeat contractions  Diastasis Recti 2 finger width separation at umbilicus with distortion in upper abdominals  (Blank rows = not tested)         TONE: low  PROLAPSE: Grade 2 in supine   TODAY'S TREATMENT:                                                                                                                              DATE:  01/11/23 Neuromuscular re-education: Supine hip adduction with transversus abdominus and pelvic floor muscle 2 x 10 Supine hip abduction red band with pelvic floor muscle and transversus abdominus 12x Bridge with hip adduction, transversus abdominus, and pelvic floor muscle 2 x 10  Supine resisted march red band 10x bil with pelvic floor muscle and transversus abdominus  Seated hip adduction ball press with transversus abdominus and pelvic floor muscle 2 x 10 Seated hip abduction red band with transversus abdominus and pelvic floor muscle 2 x 10 Seated resisted march red band with transversus abdominus and pelvic floor muscle 2 x 10 Exercises: Lower trunk rotations 2 x 10 Seated piriformis stretch 60 seconds bil   12/28/22 Manual: Supine soft tissue mobilization to Rt adductors with P/ROM  Neuromuscular re-education: Supine hip adduction with transversus abdominus and pelvic floor muscle 2 x 10 Supine hip abduction red band with pelvic floor muscle and transversus abdominus 12x Bridge with hip adduction, transversus abdominus, and pelvic floor muscle 2 x 10  Supine resisted march red band 10x bil with pelvic floor muscle and transversus abdominus  Exercises: Bent knee fall out 10x bil  Standing adductor stretch at counter 60 sec bil Lower trunk rotations 2 x 10  12/21/22 Neuromuscular re-education: Transversus abdominus training with multimodal cues for improved motor control and breath coordination  Transversus abdominus isometric in supine 10x Supine bil UE squeeze with transversus abdominus and pelvic floor muscle 12x Supine hip abduction red band with pelvic floor muscle and transversus abdominus 12x Bridge with hip adduction, transversus abdominus, and pelvic floor muscle 2 x 10   Supine resisted march red band 10x bil with pelvic floor muscle and transversus abdominus  Therapeutic activities: Urge drill review  Pelvic floor muscle strengthening HEP review The knack Timing of fluids at night - stopping 3 hours before bed    PATIENT EDUCATION:  Education details: See above Person educated: Patient Education method: Programmer, multimedia, Demonstration, Tactile cues, Verbal cues, and Handouts Education comprehension: verbalized understanding  HOME EXERCISE PROGRAM: [id number]  ASSESSMENT:  CLINICAL IMPRESSION: Pt beginning to see larger improvements with bladder control throughout the day and is not waking up as often to urinate at night. She has had difficulty performing HEP as often as she'd like over the holidays. We reviewed HEP today and she did not have any cramping in Rt LE. She did very well with all exercise progressions this session in seated position and HEP updated. She will continue to benefit from skilled PT intervention in order to improve bladder control and begin/progress functional strengthening program.   OBJECTIVE IMPAIRMENTS: decreased activity tolerance, decreased coordination, decreased endurance, decreased strength, increased fascial restrictions, increased muscle spasms, impaired tone, postural dysfunction, and pain.   ACTIVITY LIMITATIONS: continence  PARTICIPATION LIMITATIONS: community activity  PERSONAL FACTORS: 1 comorbidity: medical history  are also affecting patient's functional outcome.   REHAB POTENTIAL: Good  CLINICAL DECISION MAKING: Stable/uncomplicated  EVALUATION COMPLEXITY: Low   GOALS: Goals reviewed with patient? Yes  SHORT TERM GOALS: Target date: 10/08/22 - 12/21/22  Pt will be independent with HEP.   Baseline: Goal status: IN PROGRESS 12/21/22  2.  Pt will be independent with the knack, urge suppression technique, and double voiding in order to improve bladder habits and decrease urinary incontinence.   Baseline:   Goal status: IN PROGRESS 12/21/22  3.  Pt will be able to correctly perform diaphragmatic breathing and appropriate pressure management in order to prevent worsening vaginal wall laxity and improve pelvic floor A/ROM.   Baseline:  Goal status: IN PROGRESS 12/21/22  LONG TERM GOALS: Target date: 11/05/22 - updated 12/21/22  Pt will be independent with advanced HEP.   Baseline:  Goal status: IN PROGRESS 12/21/22  2.  Pt will decrease pad use to no more than 1 during the day and 1 at night.  Baseline: she is now only using 3-4 pads a day (was using 5) Goal status: IN PROGRESS 12/21/22  3.  Pt will be able to go 2-3 hours in between voids without urgency or incontinence in order to improve QOL and perform all functional activities with less difficulty.   Baseline: She can go up to 4 hours without urgency Goal status: MET 12/21/22  4.  Pt will demonstrate normal pelvic floor muscle tone and A/ROM, able to achieve 4/5 strength with contractions and 10 sec endurance, in order to provide appropriate lumbopelvic support in functional activities.   Baseline:  Goal status: IN PROGRESS 12/21/22  5.  Pt will report no leaks with laughing, coughing, sneezing in order to improve comfort with interpersonal relationships and community activities.   Baseline:  Goal status: INITIAL   PLAN:  PT FREQUENCY: 1-2x/week  PT DURATION: 8 weeks  PLANNED INTERVENTIONS: Therapeutic exercises, Therapeutic activity, Neuromuscular re-education, Balance training, Gait training, Patient/Family education, Self Care, Joint mobilization, Dry Needling, Biofeedback, and Manual therapy  PLAN FOR NEXT SESSION: Progress core strengthening activities.   Julio Alm, PT, DPT12/30/2412:32 PM

## 2023-01-18 ENCOUNTER — Ambulatory Visit: Payer: Medicare Other | Attending: Family Medicine

## 2023-01-18 DIAGNOSIS — R293 Abnormal posture: Secondary | ICD-10-CM | POA: Diagnosis not present

## 2023-01-18 DIAGNOSIS — R279 Unspecified lack of coordination: Secondary | ICD-10-CM | POA: Diagnosis not present

## 2023-01-18 DIAGNOSIS — M6281 Muscle weakness (generalized): Secondary | ICD-10-CM | POA: Insufficient documentation

## 2023-01-18 NOTE — Therapy (Signed)
 OUTPATIENT PHYSICAL THERAPY FEMALE PELVIC TREATMENT   Patient Name: Holly Morrison MRN: [id number] DOB:01/01/1951, 73 y.o., female Today's Date: 01/18/2023  END OF SESSION:  PT End of Session - 01/18/23 1145     Visit Number 5    Date for PT Re-Evaluation 02/15/23    Authorization Type [employer] medicare    Authorization Time Period 12/14/2022 to 02/08/2023    Authorization - Visit Number 4    Authorization - Number of Visits 8    Progress Note Due on Visit 10    PT Start Time 1145    PT Stop Time 1225    PT Time Calculation (min) 40 min    Activity Tolerance Patient tolerated treatment well    Behavior During Therapy WFL for tasks assessed/performed                Past Medical History:  Diagnosis Date   Hyperlipidemia    Hypertension    MVP (mitral valve prolapse)    Past Surgical History:  Procedure Laterality Date   TONSILLECTOMY     There are no active problems to display for this patient.   PCP: Katina Pfeiffer, PA-C  REFERRING PROVIDER: Katina Pfeiffer, PA-C  REFERRING DIAG: R35.0 (ICD-10-CM) - Frequency of micturition  THERAPY DIAG:  Muscle weakness (generalized)  Unspecified lack of coordination  Abnormal posture  Rationale for Evaluation and Treatment: Rehabilitation  ONSET DATE: 2 years  SUBJECTIVE:                                                                                                                                                                                           SUBJECTIVE STATEMENT: Pt reports feeling like her bladder ocntinues to improve.  PAIN:  Are you having pain? No   PRECAUTIONS: None  RED FLAGS: None   WEIGHT BEARING RESTRICTIONS: No  FALLS:  Has patient fallen in last 6 months? No  LIVING ENVIRONMENT: Lives with: lives with their family Lives in: House/apartment   OCCUPATION: reitred  PLOF: Independent  PATIENT GOALS: bladder control  PERTINENT HISTORY:  G3P3  BOWEL MOVEMENT: Pain  with bowel movement: No Type of bowel movement:Frequency 1x/day and Strain No Fully empty rectum: Yes: - Leakage: No Pads: No Fiber supplement: No  URINATION: Pain with urination: No Fully empty bladder: Yes: but she has to lean forward at the end to finish - but only sometimes  Stream: Strong Urgency: Yes: will leak if she does not go right away Frequency: 2-3 hours, 2x/night Leakage: Urge to void, Walking to the bathroom, Coughing, Sneezing, Laughing, and stepping down or jumping  Pads: Yes: 3-4 pads a day, 1  at night  INTERCOURSE: Not currently sexually active, no history of pain   PREGNANCY: Vaginal deliveries 3 Tearing No   PROLAPSE: Once in a while, not bothersome   OBJECTIVE:  12/21/22: Core strength: pt able to find transversus abdominus contraction with pelvic floor muscle contraction today in supine; initially could not contract with breathing and used increased abdominal pressure to achieve   09/10/22:  COGNITION: Overall cognitive status: Within functional limits for tasks assessed     SENSATION: Light touch: Appears intact Proprioception: Appears intact  GAIT: Comments: decreased bil hip extension  POSTURE: rounded shoulders, forward head, decreased lumbar lordosis, increased thoracic kyphosis, and posterior pelvic tilt   PALPATION:   General  No abdominal tenderness                External Perineal Exam WNL                             Internal Pelvic Floor Low tone, no tenderness  Patient confirms identification and approves PT to assess internal pelvic floor and treatment Yes  PELVIC MMT:   MMT eval  Vaginal 2/5, 4 second hold, 3 repeat contractions  Diastasis Recti 2 finger width separation at umbilicus with distortion in upper abdominals  (Blank rows = not tested)        TONE: low  PROLAPSE: Grade 2 in supine   TODAY'S TREATMENT:                                                                                                                               DATE:  01/18/23 Neuromuscular re-education: Bridge with hip adduction, transversus abdominus, and pelvic floor muscle 2 x 10 Bridge with hip abduction red band 2 x 10 Seated hip adduction ball press with transversus abdominus and pelvic floor muscle 2 x 10 Seated hip abduction red band with transversus abdominus and pelvic floor muscle 2 x 10 Seated resisted march red band with transversus abdominus and pelvic floor muscle 2 x 10 Heel raises with pelvic floor muscle and transversus abdominus 2 x 10 3-way kick 10x each, bil  01/11/23 Neuromuscular re-education: Supine hip adduction with transversus abdominus and pelvic floor muscle 2 x 10 Supine hip abduction red band with pelvic floor muscle and transversus abdominus 12x Bridge with hip adduction, transversus abdominus, and pelvic floor muscle 2 x 10  Supine resisted march red band 10x bil with pelvic floor muscle and transversus abdominus  Seated hip adduction ball press with transversus abdominus and pelvic floor muscle 2 x 10 Seated hip abduction red band with transversus abdominus and pelvic floor muscle 2 x 10 Seated resisted march red band with transversus abdominus and pelvic floor muscle 2 x 10 Exercises: Lower trunk rotations 2 x 10 Seated piriformis stretch 60 seconds bil   12/28/22 Manual: Supine soft tissue mobilization to Rt adductors with P/ROM  Neuromuscular re-education: Supine hip adduction with transversus  abdominus and pelvic floor muscle 2 x 10 Supine hip abduction red band with pelvic floor muscle and transversus abdominus 12x Bridge with hip adduction, transversus abdominus, and pelvic floor muscle 2 x 10  Supine resisted march red band 10x bil with pelvic floor muscle and transversus abdominus  Exercises: Bent knee fall out 10x bil Standing adductor stretch at counter 60 sec bil Lower trunk rotations 2 x 10   PATIENT EDUCATION:  Education details: See above Person educated:  Patient Education method: Explanation, Demonstration, Tactile cues, Verbal cues, and Handouts Education comprehension: verbalized understanding  HOME EXERCISE PROGRAM: [id number]  ASSESSMENT:  CLINICAL IMPRESSION: Pt continuing to see improvements with bladder over the last week. She did well with all exercises, but did start to have some cramping in Rt LE during seated exercises. She states that the cramping is improving in general and we discussed that stretching and strengthening is likely helping with this. She did well with exercise progressions and is showing good understanding of core facilitation and breath coordination. HEP updated. Due to progress and having met personal goals for being in PT, she is prepared to discharge at this time.   OBJECTIVE IMPAIRMENTS: decreased activity tolerance, decreased coordination, decreased endurance, decreased strength, increased fascial restrictions, increased muscle spasms, impaired tone, postural dysfunction, and pain.   ACTIVITY LIMITATIONS: continence  PARTICIPATION LIMITATIONS: community activity  PERSONAL FACTORS: 1 comorbidity: medical history  are also affecting patient's functional outcome.   REHAB POTENTIAL: Good  CLINICAL DECISION MAKING: Stable/uncomplicated  EVALUATION COMPLEXITY: Low   GOALS: Goals reviewed with patient? Yes  SHORT TERM GOALS: Target date: 10/08/22 - 12/21/22 - updated 01/18/23  Pt will be independent with HEP.   Baseline: Goal status: MET 01/18/2023  2.  Pt will be independent with the knack, urge suppression technique, and double voiding in order to improve bladder habits and decrease urinary incontinence.   Baseline:  Goal status: MET 01/18/2023  3.  Pt will be able to correctly perform diaphragmatic breathing and appropriate pressure management in order to prevent worsening vaginal wall laxity and improve pelvic floor A/ROM.   Baseline:  Goal status:MET 01/18/2023  LONG TERM GOALS: Target date: 11/05/22  - updated 12/21/22 - updated 01/18/23  Pt will be independent with advanced HEP.   Baseline:  Goal status: MET 01/18/2023  2.  Pt will decrease pad use to no more than 1 during the day and 1 at night.  Baseline:  Goal status: MET 01/18/2023  3.  Pt will be able to go 2-3 hours in between voids without urgency or incontinence in order to improve QOL and perform all functional activities with less difficulty.   Baseline:  Goal status: MET 12/21/22  4.  Pt will demonstrate normal pelvic floor muscle tone and A/ROM, able to achieve 4/5 strength with contractions and 10 sec endurance, in order to provide appropriate lumbopelvic support in functional activities.   Baseline: not formally assessed  Goal status: DISCHARGED 01/18/23  5.  Pt will report no leaks with laughing, coughing, sneezing in order to improve comfort with interpersonal relationships and community activities.   Baseline:  Goal status: MET 01/18/2023   PLAN:  PT FREQUENCY: -  PT DURATION: -  PLANNED INTERVENTIONS: -  PLAN FOR NEXT SESSION: -    PHYSICAL THERAPY DISCHARGE SUMMARY  Visits from Start of Care: 5  Current functional level related to goals / functional outcomes: Independent   Remaining deficits: See above   Education / Equipment: HEP   Patient agrees to  discharge. Patient goals were met. Patient is being discharged due to meeting the stated rehab goals.   Josette Mares, PT, DPT01/06/251:54 PM

## 2023-02-09 DIAGNOSIS — M5432 Sciatica, left side: Secondary | ICD-10-CM | POA: Diagnosis not present

## 2023-02-09 DIAGNOSIS — K120 Recurrent oral aphthae: Secondary | ICD-10-CM | POA: Diagnosis not present

## 2023-02-09 DIAGNOSIS — R5383 Other fatigue: Secondary | ICD-10-CM | POA: Diagnosis not present

## 2023-04-02 DIAGNOSIS — L603 Nail dystrophy: Secondary | ICD-10-CM | POA: Diagnosis not present

## 2023-04-02 DIAGNOSIS — M216X2 Other acquired deformities of left foot: Secondary | ICD-10-CM | POA: Diagnosis not present

## 2023-04-02 DIAGNOSIS — L84 Corns and callosities: Secondary | ICD-10-CM | POA: Diagnosis not present

## 2023-04-02 DIAGNOSIS — B351 Tinea unguium: Secondary | ICD-10-CM | POA: Diagnosis not present

## 2023-04-02 DIAGNOSIS — M79674 Pain in right toe(s): Secondary | ICD-10-CM | POA: Diagnosis not present

## 2023-04-25 ENCOUNTER — Other Ambulatory Visit: Payer: Self-pay | Admitting: Cardiology

## 2023-04-25 DIAGNOSIS — E7800 Pure hypercholesterolemia, unspecified: Secondary | ICD-10-CM

## 2023-08-08 ENCOUNTER — Other Ambulatory Visit: Payer: Self-pay | Admitting: Cardiology

## 2023-08-08 DIAGNOSIS — E7800 Pure hypercholesterolemia, unspecified: Secondary | ICD-10-CM

## 2023-12-29 ENCOUNTER — Other Ambulatory Visit: Payer: Self-pay | Admitting: Family Medicine

## 2023-12-29 DIAGNOSIS — Z1231 Encounter for screening mammogram for malignant neoplasm of breast: Secondary | ICD-10-CM

## 2023-12-30 ENCOUNTER — Ambulatory Visit

## 2023-12-30 DIAGNOSIS — Z1231 Encounter for screening mammogram for malignant neoplasm of breast: Secondary | ICD-10-CM | POA: Diagnosis not present

## 2024-01-09 ENCOUNTER — Emergency Department (HOSPITAL_BASED_OUTPATIENT_CLINIC_OR_DEPARTMENT_OTHER): Admission: EM | Admit: 2024-01-09 | Discharge: 2024-01-09 | Discharge status: Absent without leave

## 2024-02-18 ENCOUNTER — Other Ambulatory Visit: Payer: Self-pay | Admitting: Cardiology

## 2024-02-18 DIAGNOSIS — E7800 Pure hypercholesterolemia, unspecified: Secondary | ICD-10-CM
